# Patient Record
Sex: Female | Born: 1960 | Race: White | Hispanic: No | Marital: Married | Smoking: Never smoker
Health system: Southern US, Community
[De-identification: ages and names within clinical notes are randomized; demographics above are authoritative.]

## PROBLEM LIST (undated history)

## (undated) DIAGNOSIS — K829 Disease of gallbladder, unspecified: Secondary | ICD-10-CM

## (undated) DIAGNOSIS — M1990 Unspecified osteoarthritis, unspecified site: Secondary | ICD-10-CM

## (undated) DIAGNOSIS — B019 Varicella without complication: Secondary | ICD-10-CM

## (undated) DIAGNOSIS — E079 Disorder of thyroid, unspecified: Secondary | ICD-10-CM

## (undated) DIAGNOSIS — G4730 Sleep apnea, unspecified: Secondary | ICD-10-CM

## (undated) DIAGNOSIS — I499 Cardiac arrhythmia, unspecified: Secondary | ICD-10-CM

## (undated) DIAGNOSIS — T7840XA Allergy, unspecified, initial encounter: Secondary | ICD-10-CM

## (undated) DIAGNOSIS — I10 Essential (primary) hypertension: Secondary | ICD-10-CM

## (undated) HISTORY — PX: SHOULDER ARTHROSCOPY: SHX128

## (undated) HISTORY — PX: CARPAL TUNNEL RELEASE: SHX101

## (undated) HISTORY — PX: CHOLECYSTECTOMY: SHX55

## (undated) HISTORY — PX: ABDOMINAL HYSTERECTOMY: SHX81

## (undated) HISTORY — PX: TONSILLECTOMY: SUR1361

---

## 2018-11-05 ENCOUNTER — Other Ambulatory Visit: Payer: Self-pay | Admitting: Family Medicine

## 2018-11-05 DIAGNOSIS — Z1231 Encounter for screening mammogram for malignant neoplasm of breast: Secondary | ICD-10-CM

## 2019-04-16 ENCOUNTER — Ambulatory Visit
Admission: RE | Admit: 2019-04-16 | Discharge: 2019-04-16 | Disposition: A | Discharge status: Home / Self Care | Payer: Federal, State, Local not specified - PPO | Source: Ambulatory Visit | Attending: Family Medicine | Admitting: Family Medicine

## 2019-04-16 ENCOUNTER — Other Ambulatory Visit: Payer: Self-pay

## 2019-04-16 DIAGNOSIS — Z1231 Encounter for screening mammogram for malignant neoplasm of breast: Secondary | ICD-10-CM | POA: Insufficient documentation

## 2019-08-27 ENCOUNTER — Other Ambulatory Visit: Payer: Self-pay | Admitting: Orthopedic Surgery

## 2019-08-27 DIAGNOSIS — M1712 Unilateral primary osteoarthritis, left knee: Secondary | ICD-10-CM

## 2019-09-03 ENCOUNTER — Ambulatory Visit
Admission: RE | Admit: 2019-09-03 | Discharge: 2019-09-03 | Disposition: A | Discharge status: Home / Self Care | Payer: Federal, State, Local not specified - PPO | Source: Ambulatory Visit | Attending: Orthopedic Surgery | Admitting: Orthopedic Surgery

## 2019-09-03 ENCOUNTER — Other Ambulatory Visit: Payer: Self-pay

## 2019-09-03 DIAGNOSIS — M1712 Unilateral primary osteoarthritis, left knee: Secondary | ICD-10-CM

## 2019-11-19 ENCOUNTER — Ambulatory Visit: Payer: Federal, State, Local not specified - PPO

## 2019-11-19 ENCOUNTER — Other Ambulatory Visit: Payer: Self-pay

## 2019-11-19 DIAGNOSIS — Z20822 Contact with and (suspected) exposure to covid-19: Secondary | ICD-10-CM

## 2019-11-19 LAB — POC COVID19 BINAXNOW: SARS Coronavirus 2 Ag: NEGATIVE

## 2019-11-19 NOTE — Progress Notes (Signed)
Patient is a new hire for [employer].  Rapid Antigen test negative.

## 2019-11-20 NOTE — Progress Notes (Signed)
Please contact patient and give results. Thank you Herbert Seta

## 2020-02-19 ENCOUNTER — Other Ambulatory Visit: Payer: Self-pay | Admitting: Family Medicine

## 2020-02-19 DIAGNOSIS — Z1231 Encounter for screening mammogram for malignant neoplasm of breast: Secondary | ICD-10-CM

## 2020-04-13 ENCOUNTER — Other Ambulatory Visit: Payer: Self-pay

## 2020-04-13 ENCOUNTER — Ambulatory Visit: Payer: Federal, State, Local not specified - PPO

## 2020-04-13 NOTE — Progress Notes (Signed)
Nutrition Consult 04/13/2020  CC:  Attending for husband to learn more regarding the carbohydrate restricted diet for diabetes. (Husband is not available today and will see me next month regarding his diabetes consult).  HX:  Husband Emily Jefferson has hx of diabetes for approximately 20 years.  A1C is increasing at this time and his weight is at 233 lb and BMI at 32.25.  Both are retired and working part-time at [employer].    They have started the Keto diet to help with controlling Emily Jefferson blood glucose.  She reports his glucose is improving but his weight is staying the same.  She is gaining weight with the increased fat in the diet. She wants to decrease the amount of fat in the diet and still not change his glucose levels.    They are monitoring label sugar levels and not considering fiber and carbohydrate in the foods and dishes.  Both are getting some exercise in the evenings in their pool.  They do golf some each week.  Wife is the primarily responsible for the cooking.  They both will do grocery shopping.    Recommendations: *Use the food label and use the carbohydrate content.  Always look for fiber in each product.   Subtract fiber for net carb intake. * Increase non-starchy vegetable intake at each meal.   *Decrease the serving size of the casserole dishes that contain multiple cheeses and other fats. *Increase the frequency and duration of exercise.  Walk in the pool and plan for exercise regime on regular basis when pool has to be closed. *Start to change the fat in dishes to low fat r/t full fats.  Start with at 1 cheese  In dish * Use lean meats.  Use chicken and fish more often that the red meats. *John needs to see me next month and we will review his dietary intake and review carb counting and his nutrition/actcivity needs.  Teaching tools: *Food/Food group handout *What's on the Nutrition Facts Label *Pre-diabetes Fact Sheet (contains a number of health eating facts for  diabetes)  Plan: F/u visit with Emily Jefferson next month.

## 2020-04-13 NOTE — Progress Notes (Unsigned)
   Subjective:    Patient ID: Emily Jefferson, female    DOB: 03-11-1961, 59 y.o.   MRN: [id number]  HPI    Review of Systems     Objective:   Physical Exam        Assessment & Plan:

## 2020-04-15 ENCOUNTER — Ambulatory Visit
Admission: RE | Admit: 2020-04-15 | Discharge: 2020-04-15 | Disposition: A | Discharge status: Home / Self Care | Payer: Federal, State, Local not specified - PPO | Source: Ambulatory Visit | Attending: Family Medicine | Admitting: Family Medicine

## 2020-04-15 ENCOUNTER — Other Ambulatory Visit: Payer: Self-pay

## 2020-04-15 DIAGNOSIS — Z1231 Encounter for screening mammogram for malignant neoplasm of breast: Secondary | ICD-10-CM | POA: Insufficient documentation

## 2020-07-06 ENCOUNTER — Other Ambulatory Visit: Payer: Self-pay

## 2020-07-06 ENCOUNTER — Other Ambulatory Visit
Admission: RE | Admit: 2020-07-06 | Discharge: 2020-07-06 | Disposition: A | Discharge status: Home / Self Care | Payer: Federal, State, Local not specified - PPO | Source: Ambulatory Visit | Attending: Internal Medicine | Admitting: Internal Medicine

## 2020-07-06 DIAGNOSIS — K5730 Diverticulosis of large intestine without perforation or abscess without bleeding: Secondary | ICD-10-CM | POA: Diagnosis not present

## 2020-07-06 DIAGNOSIS — Z09 Encounter for follow-up examination after completed treatment for conditions other than malignant neoplasm: Secondary | ICD-10-CM | POA: Diagnosis not present

## 2020-07-06 DIAGNOSIS — Z79899 Other long term (current) drug therapy: Secondary | ICD-10-CM | POA: Diagnosis not present

## 2020-07-06 DIAGNOSIS — Z20822 Contact with and (suspected) exposure to covid-19: Secondary | ICD-10-CM | POA: Diagnosis not present

## 2020-07-06 DIAGNOSIS — K640 First degree hemorrhoids: Secondary | ICD-10-CM | POA: Diagnosis not present

## 2020-07-06 DIAGNOSIS — Z01812 Encounter for preprocedural laboratory examination: Secondary | ICD-10-CM | POA: Insufficient documentation

## 2020-07-06 DIAGNOSIS — Z86010 Personal history of colonic polyps: Secondary | ICD-10-CM | POA: Diagnosis not present

## 2020-07-06 DIAGNOSIS — Z79890 Hormone replacement therapy (postmenopausal): Secondary | ICD-10-CM | POA: Diagnosis not present

## 2020-07-07 ENCOUNTER — Encounter: Payer: Self-pay | Admitting: Internal Medicine

## 2020-07-07 LAB — SARS CORONAVIRUS 2 (TAT 6-24 HRS): SARS Coronavirus 2: NEGATIVE

## 2020-07-08 ENCOUNTER — Ambulatory Visit: Payer: Federal, State, Local not specified - PPO | Admitting: Anesthesiology

## 2020-07-08 ENCOUNTER — Ambulatory Visit
Admission: RE | Admit: 2020-07-08 | Discharge: 2020-07-08 | Disposition: A | Discharge status: Home / Self Care | Payer: Federal, State, Local not specified - PPO | Attending: Internal Medicine | Admitting: Internal Medicine

## 2020-07-08 ENCOUNTER — Encounter: Payer: Self-pay | Admitting: Internal Medicine

## 2020-07-08 ENCOUNTER — Encounter
Admission: RE | Disposition: A | Discharge status: Home / Self Care | Payer: Self-pay | Source: Home / Self Care | Attending: Internal Medicine

## 2020-07-08 DIAGNOSIS — K640 First degree hemorrhoids: Secondary | ICD-10-CM | POA: Insufficient documentation

## 2020-07-08 DIAGNOSIS — Z09 Encounter for follow-up examination after completed treatment for conditions other than malignant neoplasm: Secondary | ICD-10-CM | POA: Diagnosis not present

## 2020-07-08 DIAGNOSIS — K5730 Diverticulosis of large intestine without perforation or abscess without bleeding: Secondary | ICD-10-CM | POA: Insufficient documentation

## 2020-07-08 DIAGNOSIS — Z79899 Other long term (current) drug therapy: Secondary | ICD-10-CM | POA: Insufficient documentation

## 2020-07-08 DIAGNOSIS — Z79890 Hormone replacement therapy (postmenopausal): Secondary | ICD-10-CM | POA: Insufficient documentation

## 2020-07-08 DIAGNOSIS — Z20822 Contact with and (suspected) exposure to covid-19: Secondary | ICD-10-CM | POA: Insufficient documentation

## 2020-07-08 DIAGNOSIS — Z86010 Personal history of colonic polyps: Secondary | ICD-10-CM | POA: Insufficient documentation

## 2020-07-08 HISTORY — DX: Varicella without complication: B01.9

## 2020-07-08 HISTORY — DX: Essential (primary) hypertension: I10

## 2020-07-08 HISTORY — DX: Allergy, unspecified, initial encounter: T78.40XA

## 2020-07-08 HISTORY — DX: Sleep apnea, unspecified: G47.30

## 2020-07-08 HISTORY — DX: Disorder of thyroid, unspecified: E07.9

## 2020-07-08 HISTORY — DX: Unspecified osteoarthritis, unspecified site: M19.90

## 2020-07-08 HISTORY — DX: Disease of gallbladder, unspecified: K82.9

## 2020-07-08 HISTORY — PX: COLONOSCOPY WITH PROPOFOL: SHX5780

## 2020-07-08 HISTORY — DX: Cardiac arrhythmia, unspecified: I49.9

## 2020-07-08 SURGERY — COLONOSCOPY WITH PROPOFOL
Anesthesia: General

## 2020-07-08 MED ORDER — PROPOFOL 500 MG/50ML IV EMUL
INTRAVENOUS | Status: AC
  Filled 2020-07-08: qty 50

## 2020-07-08 MED ORDER — SODIUM CHLORIDE 0.9 % IV SOLN: INTRAVENOUS | Status: DC

## 2020-07-08 MED ORDER — PROPOFOL 10 MG/ML IV BOLUS
INTRAVENOUS | Status: DC | PRN
  Administered 2020-07-08: 100 mg via INTRAVENOUS

## 2020-07-08 MED ORDER — PROPOFOL 500 MG/50ML IV EMUL
INTRAVENOUS | Status: DC | PRN
  Administered 2020-07-08: 100 ug/kg/min via INTRAVENOUS

## 2020-07-08 MED ORDER — LIDOCAINE HCL (PF) 2 % IJ SOLN
INTRAMUSCULAR | Status: AC
  Filled 2020-07-08: qty 5

## 2020-07-08 NOTE — Interval H&P Note (Signed)
History and Physical Interval Note:  07/08/2020 9:22 AM  Hubbard Hartshorn  has presented today for surgery, with the diagnosis of screening.  The various methods of treatment have been discussed with the patient and family. After consideration of risks, benefits and other options for treatment, the patient has consented to  Procedure(s): COLONOSCOPY WITH PROPOFOL (N/A) as a surgical intervention.  The patient's history has been reviewed, patient examined, no change in status, stable for surgery.  I have reviewed the patient's chart and labs.  Questions were answered to the patient's satisfaction.     [place], [place]

## 2020-07-08 NOTE — Transfer of Care (Signed)
Immediate Anesthesia Transfer of Care Note  Patient: Emily Jefferson  Procedure(s) Performed: COLONOSCOPY WITH PROPOFOL (N/A )  Patient Location: Endoscopy Unit  Anesthesia Type:General  Level of Consciousness: drowsy and patient cooperative  Airway & Oxygen Therapy: Patient Spontanous Breathing and Patient connected to nasal cannula oxygen  Post-op Assessment: Report given to RN and Post -op Vital signs reviewed and stable  Post vital signs: Reviewed and stable  Last Vitals:  Vitals Value Taken Time  BP 119/69 07/08/20 0957  Temp    Pulse 64 07/08/20 0957  Resp 21 07/08/20 0957  SpO2 94 % 07/08/20 0957  Vitals shown include unvalidated device data.  Last Pain:  Vitals:   07/08/20 0909  TempSrc: Tympanic  PainSc: 0-No pain         Complications: No complications documented.

## 2020-07-08 NOTE — Anesthesia Preprocedure Evaluation (Signed)
Anesthesia Evaluation  Patient identified by MRN, date of birth, ID band Patient awake    Reviewed: Allergy & Precautions, NPO status , Patient's Chart, lab work & pertinent test results  Airway Mallampati: II   Neck ROM: Full    Dental no notable dental hx.    Pulmonary neg pulmonary ROS,    Pulmonary exam normal breath sounds clear to auscultation       Cardiovascular hypertension, Normal cardiovascular exam+ dysrhythmias  Rhythm:Regular Rate:Normal     Neuro/Psych negative neurological ROS  negative psych ROS   GI/Hepatic negative GI ROS, Neg liver ROS,   Endo/Other  negative endocrine ROS  Renal/GU negative Renal ROS  negative genitourinary   Musculoskeletal  (+) Arthritis ,   Abdominal   Peds negative pediatric ROS (+)  Hematology negative hematology ROS (+)   Anesthesia Other Findings .Marland KitchenPast Medical History: No date: Allergy No date: Arthritis No date: Chicken pox No date: Dysrhythmia No date: Gallbladder disease No date: Hypertension No date: Sleep apnea No date: Thyroid disease   Reproductive/Obstetrics negative OB ROS                             Anesthesia Physical Anesthesia Plan  ASA: II  Anesthesia Plan: General   Post-op Pain Management:    Induction: Intravenous  PONV Risk Score and Plan: 3 and Propofol infusion  Airway Management Planned: Nasal Cannula  Additional Equipment: None  Intra-op Plan:   Post-operative Plan:   Informed Consent: I have reviewed the patients History and Physical, chart, labs and discussed the procedure including the risks, benefits and alternatives for the proposed anesthesia with the patient or authorized representative who has indicated his/her understanding and acceptance.       Plan Discussed with: CRNA, Anesthesiologist and Surgeon  Anesthesia Plan Comments:         Anesthesia Quick Evaluation

## 2020-07-08 NOTE — H&P (Signed)
Outpatient short stay form Pre-procedure 07/08/2020 9:21 AM Shivank Pinedo K. Norma Fredrickson, M.D.  Primary Physician: Jerl Mina, M.D.  Reason for visit:  Personal history of adenomatous colon polyps  History of present illness:                           Patient presents for colonoscopy for a personal hx of colon polyps. The patient denies abdominal pain, abnormal weight loss or rectal bleeding.      Current Facility-Administered Medications:  .  0.9 %  sodium chloride infusion, , Intravenous, Continuous, Jaquayla Hege, Boykin Nearing, MD  Medications Prior to Admission  Medication Sig Dispense Refill Last Dose  . Azelastine HCl 137 MCG/SPRAY SOLN Place 1 spray into both nostrils. Use in each nostril as directed     . cholecalciferol (VITAMIN D3) 10 MCG (400 UNIT) TABS tablet Take 400 Units by mouth 2 (two) times daily.     Marland Kitchen glucosamine-chondroitin 500-400 MG tablet Take 1 tablet by mouth 3 (three) times daily.     Marland Kitchen levocetirizine (XYZAL) 5 MG tablet Take 5 mg by mouth every evening.   07/07/2020 at Unknown time  . levothyroxine (SYNTHROID) 137 MCG tablet Take 137 mcg by mouth daily before breakfast.   07/08/2020 at Unknown time  . losartan-hydrochlorothiazide (HYZAAR) 100-25 MG tablet Take 1 tablet by mouth daily.   07/08/2020 at Unknown time  . montelukast (SINGULAIR) 10 MG tablet Take 10 mg by mouth at bedtime.   07/07/2020 at Unknown time  . Multiple Vitamin (MULTIVITAMIN) capsule Take 1 capsule by mouth daily.        Allergies  Allergen Reactions  . Penicillins Rash     Past Medical History:  Diagnosis Date  . Allergy   . Arthritis   . Chicken pox   . Dysrhythmia   . Gallbladder disease   . Hypertension   . Sleep apnea   . Thyroid disease     Review of systems:  Otherwise negative.    Physical Exam  Gen: Alert, oriented. Appears stated age.  HEENT: [place]/AT. PERRLA. Lungs: CTA, no wheezes. CV: RR nl S1, S2. Abd: soft, benign, no masses. BS+ Ext: No edema. Pulses  2+    Planned procedures: Proceed with colonoscopy. The patient understands the nature of the planned procedure, indications, risks, alternatives and potential complications including but not limited to bleeding, infection, perforation, damage to internal organs and possible oversedation/side effects from anesthesia. The patient agrees and gives consent to proceed.  Please refer to procedure notes for findings, recommendations and patient disposition/instructions.     Mirren Gest K. Norma Fredrickson, M.D. Gastroenterology 07/08/2020  9:21 AM

## 2020-07-08 NOTE — Anesthesia Postprocedure Evaluation (Signed)
Anesthesia Post Note  Patient: Emily Jefferson  Procedure(s) Performed: COLONOSCOPY WITH PROPOFOL (N/A )  Patient location during evaluation: Endoscopy Anesthesia Type: General Level of consciousness: awake and awake and alert Pain management: pain level controlled Vital Signs Assessment: post-procedure vital signs reviewed and stable Respiratory status: spontaneous breathing and nonlabored ventilation Cardiovascular status: blood pressure returned to baseline and stable Postop Assessment: no apparent nausea or vomiting Anesthetic complications: no   No complications documented.   Last Vitals:  Vitals:   07/08/20 0957 07/08/20 1000  BP: 119/69 122/65  Pulse: 63 64  Resp: 16 (!) 21  Temp:    SpO2: 96% 94%    Last Pain:  Vitals:   07/08/20 1000  TempSrc:   PainSc: 0-No pain                 [name]

## 2020-07-08 NOTE — Op Note (Signed)
[facility] Gastroenterology Patient Name: Emily Jefferson Procedure Date: 07/08/2020 9:37 AM MRN: [id number] Account #: [id number] Date of Birth: 27-Dec-1960 Admit Type: Outpatient Age: 59 Room: [facility] ENDO ROOM 1 Gender: Female Note Status: Finalized Procedure:             Colonoscopy Indications:           Surveillance: Personal history of adenomatous polyps                         on last colonoscopy > 5 years ago Providers:             Royce Macadamia K. Norma Fredrickson MD, MD Referring MD:          Rhona Leavens. Burnett Sheng, MD (Referring MD) Medicines:             Propofol per Anesthesia Complications:         No immediate complications. Procedure:             Pre-Anesthesia Assessment:                        - The risks and benefits of the procedure and the                         sedation options and risks were discussed with the                         patient. All questions were answered and informed                         consent was obtained.                        - Patient identification and proposed procedure were                         verified prior to the procedure by the nurse. The                         procedure was verified in the procedure room.                        - ASA Grade Assessment: III - A patient with severe                         systemic disease.                        - After reviewing the risks and benefits, the patient                         was deemed in satisfactory condition to undergo the                         procedure.                        After obtaining informed consent, the colonoscope was                         passed under direct  vision. Throughout the procedure,                         the patient's blood pressure, pulse, and oxygen                         saturations were monitored continuously. The                         Colonoscope was introduced through the anus and                         advanced to the the cecum, identified  by appendiceal                         orifice and ileocecal valve. The colonoscopy was                         performed without difficulty. The patient tolerated                         the procedure well. The quality of the bowel                         preparation was good. Findings:      The perianal and digital rectal examinations were normal. Pertinent       negatives include normal sphincter tone and no palpable rectal lesions.      Many small-mouthed diverticula were found in the left colon. There was       no evidence of diverticular bleeding.      Non-bleeding internal hemorrhoids were found during retroflexion. The       hemorrhoids were Grade I (internal hemorrhoids that do not prolapse).      The exam was otherwise without abnormality. Impression:            - Mild diverticulosis in the left colon. There was no                         evidence of diverticular bleeding.                        - Non-bleeding internal hemorrhoids.                        - The examination was otherwise normal.                        - No specimens collected. Recommendation:        - Patient has a contact number available for                         emergencies. The signs and symptoms of potential                         delayed complications were discussed with the patient.                         Return to normal activities tomorrow. Written  discharge instructions were provided to the patient.                        - Resume previous diet.                        - Continue present medications.                        - Repeat colonoscopy in 10 years for screening                         purposes.                        - Return to GI office PRN.                        - The findings and recommendations were discussed with                         the patient. Procedure Code(s):     --- Professional ---                        P8099, Colorectal cancer screening;  colonoscopy on                         individual at high risk Diagnosis Code(s):     --- Professional ---                        K57.30, Diverticulosis of large intestine without                         perforation or abscess without bleeding                        K64.0, First degree hemorrhoids                        Z86.010, Personal history of colonic polyps CPT copyright 2019 American Medical Association. All rights reserved. The codes documented in this report are preliminary and upon coder review may  be revised to meet current compliance requirements. Stanton Kidney MD, MD 07/08/2020 9:56:36 AM This report has been signed electronically. Number of Addenda: 0 Note Initiated On: 07/08/2020 9:37 AM Scope Withdrawal Time: 0 hours 5 minutes 30 seconds  Total Procedure Duration: 0 hours 8 minutes 36 seconds  Estimated Blood Loss:  Estimated blood loss: none.      [facility]

## 2020-08-10 ENCOUNTER — Ambulatory Visit: Payer: Federal, State, Local not specified - PPO

## 2021-03-14 ENCOUNTER — Other Ambulatory Visit: Payer: Self-pay | Admitting: Family Medicine

## 2021-03-14 DIAGNOSIS — Z1231 Encounter for screening mammogram for malignant neoplasm of breast: Secondary | ICD-10-CM

## 2021-04-19 ENCOUNTER — Other Ambulatory Visit: Payer: Self-pay

## 2021-04-19 ENCOUNTER — Ambulatory Visit
Admission: RE | Admit: 2021-04-19 | Discharge: 2021-04-19 | Disposition: A | Discharge status: Home / Self Care | Payer: Federal, State, Local not specified - PPO | Source: Ambulatory Visit | Attending: Family Medicine | Admitting: Family Medicine

## 2021-04-19 DIAGNOSIS — Z1231 Encounter for screening mammogram for malignant neoplasm of breast: Secondary | ICD-10-CM | POA: Diagnosis not present

## 2021-06-10 ENCOUNTER — Ambulatory Visit: Payer: Federal, State, Local not specified - PPO | Attending: Internal Medicine

## 2021-06-10 ENCOUNTER — Ambulatory Visit: Payer: Federal, State, Local not specified - PPO

## 2021-06-10 ENCOUNTER — Other Ambulatory Visit: Payer: Self-pay

## 2021-06-10 DIAGNOSIS — Z23 Encounter for immunization: Secondary | ICD-10-CM

## 2021-06-10 MED ORDER — PFIZER COVID-19 VAC BIVALENT 30 MCG/0.3ML IM SUSP
INTRAMUSCULAR | 0 refills | Status: DC
  Filled 2021-06-10: qty 0.3, 1d supply, fill #0

## 2021-06-10 NOTE — Progress Notes (Signed)
   Covid-19 Vaccination Clinic  Name:  Emily Jefferson    MRN: [id number] DOB: Nov 30, 1960  06/10/2021  Emily Jefferson was observed post Covid-19 immunization for 15 minutes without incident. She was provided with Vaccine Information Sheet and instruction to access the V-Safe system.   Emily Jefferson was instructed to call 911 with any severe reactions post vaccine: Difficulty breathing  Swelling of face and throat  A fast heartbeat  A bad rash all over body  Dizziness and weakness   Drusilla Kanner, PharmD, MBA Clinical Acute Care Pharmacist

## 2022-01-03 ENCOUNTER — Encounter: Payer: Federal, State, Local not specified - PPO | Admitting: Obstetrics and Gynecology

## 2022-03-10 ENCOUNTER — Other Ambulatory Visit: Payer: Self-pay | Admitting: Family Medicine

## 2022-03-10 DIAGNOSIS — Z1231 Encounter for screening mammogram for malignant neoplasm of breast: Secondary | ICD-10-CM

## 2022-04-20 ENCOUNTER — Ambulatory Visit
Admission: RE | Admit: 2022-04-20 | Discharge: 2022-04-20 | Disposition: A | Discharge status: Home / Self Care | Payer: Federal, State, Local not specified - PPO | Source: Ambulatory Visit | Attending: Family Medicine | Admitting: Family Medicine

## 2022-04-20 DIAGNOSIS — Z1231 Encounter for screening mammogram for malignant neoplasm of breast: Secondary | ICD-10-CM | POA: Diagnosis present

## 2022-05-03 DIAGNOSIS — G4733 Obstructive sleep apnea (adult) (pediatric): Secondary | ICD-10-CM | POA: Diagnosis not present

## 2022-06-27 DIAGNOSIS — E063 Autoimmune thyroiditis: Secondary | ICD-10-CM | POA: Diagnosis not present

## 2022-06-27 DIAGNOSIS — E039 Hypothyroidism, unspecified: Secondary | ICD-10-CM | POA: Diagnosis not present

## 2022-07-04 DIAGNOSIS — Z6841 Body Mass Index (BMI) 40.0 and over, adult: Secondary | ICD-10-CM | POA: Diagnosis not present

## 2022-07-04 DIAGNOSIS — E669 Obesity, unspecified: Secondary | ICD-10-CM | POA: Diagnosis not present

## 2022-07-04 DIAGNOSIS — I10 Essential (primary) hypertension: Secondary | ICD-10-CM | POA: Diagnosis not present

## 2022-07-04 DIAGNOSIS — E039 Hypothyroidism, unspecified: Secondary | ICD-10-CM | POA: Diagnosis not present

## 2022-07-04 DIAGNOSIS — E063 Autoimmune thyroiditis: Secondary | ICD-10-CM | POA: Diagnosis not present

## 2022-07-05 DIAGNOSIS — I2089 Other forms of angina pectoris: Secondary | ICD-10-CM | POA: Diagnosis not present

## 2022-07-05 DIAGNOSIS — I10 Essential (primary) hypertension: Secondary | ICD-10-CM | POA: Diagnosis not present

## 2022-07-05 DIAGNOSIS — G4733 Obstructive sleep apnea (adult) (pediatric): Secondary | ICD-10-CM | POA: Diagnosis not present

## 2022-07-24 DIAGNOSIS — I10 Essential (primary) hypertension: Secondary | ICD-10-CM | POA: Diagnosis not present

## 2022-07-24 DIAGNOSIS — E039 Hypothyroidism, unspecified: Secondary | ICD-10-CM | POA: Diagnosis not present

## 2022-07-24 DIAGNOSIS — Z23 Encounter for immunization: Secondary | ICD-10-CM | POA: Diagnosis not present

## 2022-08-15 DIAGNOSIS — G4733 Obstructive sleep apnea (adult) (pediatric): Secondary | ICD-10-CM | POA: Diagnosis not present

## 2022-09-04 ENCOUNTER — Encounter: Payer: Self-pay | Admitting: Physician Assistant

## 2022-09-04 ENCOUNTER — Ambulatory Visit (INDEPENDENT_AMBULATORY_CARE_PROVIDER_SITE_OTHER): Payer: Self-pay | Admitting: Physician Assistant

## 2022-09-04 VITALS — BP 132/82 | HR 78 | Temp 98.0°F | Wt 315.6 lb

## 2022-09-04 DIAGNOSIS — R058 Other specified cough: Secondary | ICD-10-CM

## 2022-09-04 DIAGNOSIS — U071 COVID-19: Secondary | ICD-10-CM

## 2022-09-04 LAB — POC SOFIA 2 FLU + SARS ANTIGEN FIA
Influenza A, POC: NEGATIVE
Influenza B, POC: NEGATIVE
SARS Coronavirus 2 Ag: POSITIVE — AB

## 2022-09-04 NOTE — Progress Notes (Signed)
[facility] [address] [place], [place] [address]   Office Visit Note  Patient Name: Emily Jefferson Date of Birth [id number]  Medical Record number [id number]  Date of Service: 09/04/2022  Chief Complaint  Patient presents with   Cough    Sat had 101 fever. Mucus is clear. Has been taking OTC meds. Headache, St that is gone. No BA or chills      62 y/o F presents to the clinic for c/o sore throat and fever x 4 days, but resolved now. She then started with cough and has continued. No known exposure to flu or covid. Has been taking otc Tylenol for her symptoms and helping. She denies body aches, chills, CP, SOB, wheezing.   Cough Pertinent negatives include no shortness of breath or wheezing.      Current Medication:  Outpatient Encounter Medications as of 09/04/2022  Medication Sig   ARMOUR THYROID 180 MG tablet Take 180 mg by mouth daily.   cholecalciferol (VITAMIN D3) 10 MCG (400 UNIT) TABS tablet Take 400 Units by mouth 2 (two) times daily.   levocetirizine (XYZAL) 5 MG tablet Take 5 mg by mouth every evening.   liothyronine (CYTOMEL) 5 MCG tablet Take by mouth.   losartan-hydrochlorothiazide (HYZAAR) 100-25 MG tablet Take 1 tablet by mouth daily.   meloxicam (MOBIC) 7.5 MG tablet Take 1 tablet by mouth 2 (two) times daily.   montelukast (SINGULAIR) 10 MG tablet Take 10 mg by mouth at bedtime.   Multiple Vitamin (MULTIVITAMIN) capsule Take 1 capsule by mouth daily.   Azelastine HCl 137 MCG/SPRAY SOLN Place 1 spray into both nostrils. Use in each nostril as directed (Patient not taking: Reported on 09/04/2022)   COVID-19 mRNA bivalent vaccine, Pfizer, (PFIZER COVID-19 VAC BIVALENT) injection Inject into the muscle. (Patient not taking: Reported on 09/04/2022)   glucosamine-chondroitin 500-400 MG tablet Take 1 tablet by mouth 3 (three) times daily. (Patient not taking: Reported on 09/04/2022)   levothyroxine (SYNTHROID) 137 MCG tablet Take 137 mcg by mouth daily before  breakfast. (Patient not taking: Reported on 09/04/2022)   No facility-administered encounter medications on file as of 09/04/2022.      Medical History: Past Medical History:  Diagnosis Date   Allergy    Arthritis    Chicken pox    Dysrhythmia    Gallbladder disease    Hypertension    Sleep apnea    Thyroid disease      Vital Signs: BP 132/82 (BP Location: Left Arm, Patient Position: Standing, Cuff Size: Normal)   Pulse 78   Temp 98 F (36.7 C) (Tympanic)   Wt (!) 315 lb 9.6 oz (143.2 kg)   SpO2 97%   BMI 52.52 kg/m    Review of Systems  Constitutional: Negative.   HENT: Negative.    Respiratory:  Positive for cough. Negative for chest tightness, shortness of breath and wheezing.     Physical Exam Constitutional:      Appearance: Normal appearance.  HENT:     Head: Atraumatic.     Right Ear: Tympanic membrane, ear canal and external ear normal.     Left Ear: Tympanic membrane, ear canal and external ear normal.     Nose: Nose normal.     Mouth/Throat:     Mouth: Mucous membranes are moist.     Pharynx: Oropharynx is clear.  Eyes:     Extraocular Movements: Extraocular movements intact.  Cardiovascular:     Rate and Rhythm: Normal rate and regular  rhythm.  Pulmonary:     Effort: Pulmonary effort is normal.     Breath sounds: Normal breath sounds.     Comments: No cough throughout the examination Musculoskeletal:     Cervical back: Neck supple.  Skin:    General: Skin is warm.  Neurological:     Mental Status: She is alert.  Psychiatric:        Mood and Affect: Mood normal.        Behavior: Behavior normal.        Thought Content: Thought content normal.        Judgment: Judgment normal.       Assessment/Plan:  1. COVID-19 virus infection  2. Cough productive of clear sputum - POC SOFIA 2 FLU + SARS ANTIGEN FIA  Reviewed positive covid test result.  Increase fluids Take otc cough expectorant and suppressant ie Mucinex DM as directed on the  box.  Reviewed CDC guidelines with patient. Pt will be isolated for 5 days from the start of symptoms and use a mask for an additional 5 days. Must be fever free for 24 hours prior to returning to campus.  General Counseling: diksha tagliaferro understanding of the findings of todays visit and agrees with plan of treatment. I have discussed any further diagnostic evaluation that may be needed or ordered today. We also reviewed her medications today. she has been encouraged to call the office with any questions or concerns that should arise related to todays visit.    Time spent:30 Wise, [place] Physician Assistant

## 2022-10-03 DIAGNOSIS — I10 Essential (primary) hypertension: Secondary | ICD-10-CM | POA: Diagnosis not present

## 2022-10-03 DIAGNOSIS — E063 Autoimmune thyroiditis: Secondary | ICD-10-CM | POA: Diagnosis not present

## 2022-10-03 DIAGNOSIS — E039 Hypothyroidism, unspecified: Secondary | ICD-10-CM | POA: Diagnosis not present

## 2022-10-10 DIAGNOSIS — Z6841 Body Mass Index (BMI) 40.0 and over, adult: Secondary | ICD-10-CM | POA: Diagnosis not present

## 2022-10-10 DIAGNOSIS — E039 Hypothyroidism, unspecified: Secondary | ICD-10-CM | POA: Diagnosis not present

## 2022-10-10 DIAGNOSIS — E063 Autoimmune thyroiditis: Secondary | ICD-10-CM | POA: Diagnosis not present

## 2022-10-10 DIAGNOSIS — I10 Essential (primary) hypertension: Secondary | ICD-10-CM | POA: Diagnosis not present

## 2022-10-10 DIAGNOSIS — E669 Obesity, unspecified: Secondary | ICD-10-CM | POA: Diagnosis not present

## 2022-11-18 ENCOUNTER — Other Ambulatory Visit: Payer: Self-pay

## 2022-11-18 ENCOUNTER — Inpatient Hospital Stay
Admission: EM | Admit: 2022-11-18 | Discharge: 2022-11-20 | DRG: 194 | Disposition: A | Discharge status: Home / Self Care | Payer: Federal, State, Local not specified - PPO | Attending: Internal Medicine | Admitting: Internal Medicine

## 2022-11-18 ENCOUNTER — Ambulatory Visit
Admission: RE | Admit: 2022-11-18 | Discharge: 2022-11-18 | Disposition: A | Discharge status: Home / Self Care | Payer: Federal, State, Local not specified - PPO | Source: Ambulatory Visit | Attending: Emergency Medicine | Admitting: Emergency Medicine

## 2022-11-18 ENCOUNTER — Emergency Department: Payer: Federal, State, Local not specified - PPO

## 2022-11-18 VITALS — BP 112/71 | HR 95 | Temp 98.3°F | Resp 22

## 2022-11-18 DIAGNOSIS — Z79890 Hormone replacement therapy (postmenopausal): Secondary | ICD-10-CM | POA: Diagnosis not present

## 2022-11-18 DIAGNOSIS — Z23 Encounter for immunization: Secondary | ICD-10-CM | POA: Diagnosis not present

## 2022-11-18 DIAGNOSIS — E039 Hypothyroidism, unspecified: Secondary | ICD-10-CM

## 2022-11-18 DIAGNOSIS — Z8249 Family history of ischemic heart disease and other diseases of the circulatory system: Secondary | ICD-10-CM

## 2022-11-18 DIAGNOSIS — R0602 Shortness of breath: Secondary | ICD-10-CM

## 2022-11-18 DIAGNOSIS — G4730 Sleep apnea, unspecified: Secondary | ICD-10-CM | POA: Diagnosis not present

## 2022-11-18 DIAGNOSIS — J209 Acute bronchitis, unspecified: Secondary | ICD-10-CM | POA: Diagnosis present

## 2022-11-18 DIAGNOSIS — J189 Pneumonia, unspecified organism: Secondary | ICD-10-CM | POA: Diagnosis not present

## 2022-11-18 DIAGNOSIS — Z833 Family history of diabetes mellitus: Secondary | ICD-10-CM

## 2022-11-18 DIAGNOSIS — Z79899 Other long term (current) drug therapy: Secondary | ICD-10-CM

## 2022-11-18 DIAGNOSIS — E876 Hypokalemia: Secondary | ICD-10-CM | POA: Diagnosis not present

## 2022-11-18 DIAGNOSIS — Z66 Do not resuscitate: Secondary | ICD-10-CM | POA: Diagnosis present

## 2022-11-18 DIAGNOSIS — Z1152 Encounter for screening for COVID-19: Secondary | ICD-10-CM | POA: Diagnosis not present

## 2022-11-18 DIAGNOSIS — R590 Localized enlarged lymph nodes: Secondary | ICD-10-CM | POA: Diagnosis present

## 2022-11-18 DIAGNOSIS — R0902 Hypoxemia: Secondary | ICD-10-CM | POA: Diagnosis not present

## 2022-11-18 DIAGNOSIS — J168 Pneumonia due to other specified infectious organisms: Secondary | ICD-10-CM | POA: Diagnosis not present

## 2022-11-18 DIAGNOSIS — R051 Acute cough: Secondary | ICD-10-CM

## 2022-11-18 DIAGNOSIS — Z880 Allergy status to penicillin: Secondary | ICD-10-CM

## 2022-11-18 DIAGNOSIS — J9811 Atelectasis: Secondary | ICD-10-CM | POA: Diagnosis not present

## 2022-11-18 DIAGNOSIS — I5A Non-ischemic myocardial injury (non-traumatic): Secondary | ICD-10-CM

## 2022-11-18 DIAGNOSIS — Z6841 Body Mass Index (BMI) 40.0 and over, adult: Secondary | ICD-10-CM

## 2022-11-18 DIAGNOSIS — I700 Atherosclerosis of aorta: Secondary | ICD-10-CM | POA: Diagnosis not present

## 2022-11-18 DIAGNOSIS — J123 Human metapneumovirus pneumonia: Principal | ICD-10-CM | POA: Diagnosis present

## 2022-11-18 DIAGNOSIS — G4733 Obstructive sleep apnea (adult) (pediatric): Secondary | ICD-10-CM | POA: Diagnosis present

## 2022-11-18 DIAGNOSIS — R059 Cough, unspecified: Secondary | ICD-10-CM | POA: Diagnosis not present

## 2022-11-18 DIAGNOSIS — B348 Other viral infections of unspecified site: Secondary | ICD-10-CM | POA: Diagnosis not present

## 2022-11-18 DIAGNOSIS — I10 Essential (primary) hypertension: Secondary | ICD-10-CM | POA: Diagnosis not present

## 2022-11-18 DIAGNOSIS — R9431 Abnormal electrocardiogram [ECG] [EKG]: Secondary | ICD-10-CM | POA: Diagnosis not present

## 2022-11-18 LAB — CBC WITH DIFFERENTIAL/PLATELET
Abs Immature Granulocytes: 0.01 10*3/uL (ref 0.00–0.07)
Basophils Absolute: 0 10*3/uL (ref 0.0–0.1)
Basophils Relative: 1 %
Eosinophils Absolute: 0.2 10*3/uL (ref 0.0–0.5)
Eosinophils Relative: 2 %
HCT: 46.9 % — ABNORMAL HIGH (ref 36.0–46.0)
Hemoglobin: 15.9 g/dL — ABNORMAL HIGH (ref 12.0–15.0)
Immature Granulocytes: 0 %
Lymphocytes Relative: 28 %
Lymphs Abs: 1.8 10*3/uL (ref 0.7–4.0)
MCH: 30.3 pg (ref 26.0–34.0)
MCHC: 33.9 g/dL (ref 30.0–36.0)
MCV: 89.3 fL (ref 80.0–100.0)
Monocytes Absolute: 0.8 10*3/uL (ref 0.1–1.0)
Monocytes Relative: 12 %
Neutro Abs: 3.6 10*3/uL (ref 1.7–7.7)
Neutrophils Relative %: 57 %
Platelets: 198 10*3/uL (ref 150–400)
RBC: 5.25 MIL/uL — ABNORMAL HIGH (ref 3.87–5.11)
RDW: 13.2 % (ref 11.5–15.5)
WBC: 6.4 10*3/uL (ref 4.0–10.5)
nRBC: 0 % (ref 0.0–0.2)

## 2022-11-18 LAB — RESP PANEL BY RT-PCR (RSV, FLU A&B, COVID)  RVPGX2
Influenza A by PCR: NEGATIVE
Influenza B by PCR: NEGATIVE
Resp Syncytial Virus by PCR: NEGATIVE
SARS Coronavirus 2 by RT PCR: NEGATIVE

## 2022-11-18 LAB — COMPREHENSIVE METABOLIC PANEL
ALT: 28 U/L (ref 0–44)
AST: 49 U/L — ABNORMAL HIGH (ref 15–41)
Albumin: 4.2 g/dL (ref 3.5–5.0)
Alkaline Phosphatase: 83 U/L (ref 38–126)
Anion gap: 11 (ref 5–15)
BUN: 16 mg/dL (ref 6–20)
CO2: 22 mmol/L (ref 22–32)
Calcium: 8.4 mg/dL — ABNORMAL LOW (ref 8.9–10.3)
Chloride: 105 mmol/L (ref 98–111)
Creatinine, Ser: 0.75 mg/dL (ref 0.44–1.00)
GFR, Estimated: >60 mL/min (ref >60)
Glucose, Bld: 121 mg/dL — ABNORMAL HIGH (ref 70–99)
Potassium: 3.3 mmol/L — ABNORMAL LOW (ref 3.5–5.1)
Sodium: 138 mmol/L (ref 135–145)
Total Bilirubin: 0.4 mg/dL (ref 0.3–1.2)
Total Protein: 8.4 g/dL — ABNORMAL HIGH (ref 6.5–8.1)

## 2022-11-18 LAB — TROPONIN I (HIGH SENSITIVITY)
Troponin I (High Sensitivity): 26 ng/L — ABNORMAL HIGH (ref <18)
Troponin I (High Sensitivity): 25 ng/L — ABNORMAL HIGH (ref <18)

## 2022-11-18 LAB — BRAIN NATRIURETIC PEPTIDE: B Natriuretic Peptide: 63.2 pg/mL (ref 0.0–100.0)

## 2022-11-18 LAB — MAGNESIUM: Magnesium: 2.1 mg/dL (ref 1.7–2.4)

## 2022-11-18 MED ORDER — IPRATROPIUM-ALBUTEROL 0.5-2.5 (3) MG/3ML IN SOLN
3.0000 mL | Freq: Once | RESPIRATORY_TRACT | Status: AC
  Administered 2022-11-18: 3 mL via RESPIRATORY_TRACT
  Filled 2022-11-18: qty 3

## 2022-11-18 MED ORDER — ASPIRIN 81 MG PO TBEC
81.0000 mg | DELAYED_RELEASE_TABLET | Freq: Every day | ORAL | Status: DC
  Administered 2022-11-18: 81 mg via ORAL
  Filled 2022-11-18: qty 1

## 2022-11-18 MED ORDER — POTASSIUM CHLORIDE CRYS ER 20 MEQ PO TBCR
40.0000 meq | EXTENDED_RELEASE_TABLET | Freq: Once | ORAL | Status: AC
  Administered 2022-11-18: 40 meq via ORAL
  Filled 2022-11-18: qty 2

## 2022-11-18 MED ORDER — ENOXAPARIN SODIUM 80 MG/0.8ML IJ SOSY
0.5000 mg/kg | PREFILLED_SYRINGE | INTRAMUSCULAR | Status: DC
  Administered 2022-11-18 – 2022-11-19 (×2): 70 mg via SUBCUTANEOUS
  Filled 2022-11-18 (×2): qty 0.72

## 2022-11-18 MED ORDER — SODIUM CHLORIDE 0.9 % IV SOLN
2.0000 g | Freq: Once | INTRAVENOUS | Status: AC
  Administered 2022-11-18: 2 g via INTRAVENOUS
  Filled 2022-11-18: qty 20

## 2022-11-18 MED ORDER — SODIUM CHLORIDE 0.9 % IV SOLN
2.0000 g | INTRAVENOUS | Status: DC
  Filled 2022-11-18: qty 20

## 2022-11-18 MED ORDER — METHYLPREDNISOLONE SODIUM SUCC 125 MG IJ SOLR
80.0000 mg | Freq: Two times a day (BID) | INTRAMUSCULAR | Status: DC
  Administered 2022-11-18 – 2022-11-19 (×2): 80 mg via INTRAVENOUS
  Filled 2022-11-18 (×3): qty 2

## 2022-11-18 MED ORDER — PNEUMOCOCCAL 20-VAL CONJ VACC 0.5 ML IM SUSY
0.5000 mL | PREFILLED_SYRINGE | INTRAMUSCULAR | Status: AC
  Administered 2022-11-19: 0.5 mL via INTRAMUSCULAR
  Filled 2022-11-18: qty 0.5

## 2022-11-18 MED ORDER — IPRATROPIUM-ALBUTEROL 0.5-2.5 (3) MG/3ML IN SOLN
3.0000 mL | Freq: Four times a day (QID) | RESPIRATORY_TRACT | Status: DC
  Administered 2022-11-19 (×3): 3 mL via RESPIRATORY_TRACT
  Filled 2022-11-18 (×3): qty 3

## 2022-11-18 MED ORDER — SODIUM CHLORIDE 0.9 % IV BOLUS
1000.0000 mL | Freq: Once | INTRAVENOUS | Status: AC
  Administered 2022-11-18: 1000 mL via INTRAVENOUS

## 2022-11-18 MED ORDER — MONTELUKAST SODIUM 10 MG PO TABS
10.0000 mg | ORAL_TABLET | Freq: Every day | ORAL | Status: DC
  Administered 2022-11-18 – 2022-11-19 (×2): 10 mg via ORAL
  Filled 2022-11-18 (×2): qty 1

## 2022-11-18 MED ORDER — ACETAMINOPHEN 325 MG PO TABS: 650.0000 mg | ORAL_TABLET | Freq: Four times a day (QID) | ORAL | Status: DC | PRN

## 2022-11-18 MED ORDER — ADULT MULTIVITAMIN W/MINERALS CH
1.0000 | ORAL_TABLET | Freq: Every day | ORAL | Status: DC
  Administered 2022-11-19 – 2022-11-20 (×2): 1 via ORAL
  Filled 2022-11-18 (×3): qty 1

## 2022-11-18 MED ORDER — IOHEXOL 350 MG/ML SOLN
75.0000 mL | Freq: Once | INTRAVENOUS | Status: AC | PRN
  Administered 2022-11-18: 75 mL via INTRAVENOUS

## 2022-11-18 MED ORDER — ALBUTEROL SULFATE (2.5 MG/3ML) 0.083% IN NEBU: 2.5000 mg | INHALATION_SOLUTION | RESPIRATORY_TRACT | Status: DC | PRN

## 2022-11-18 MED ORDER — HYDROCHLOROTHIAZIDE 25 MG PO TABS
25.0000 mg | ORAL_TABLET | Freq: Every day | ORAL | Status: DC
  Administered 2022-11-19 – 2022-11-20 (×2): 25 mg via ORAL
  Filled 2022-11-18 (×2): qty 1

## 2022-11-18 MED ORDER — METHYLPREDNISOLONE SODIUM SUCC 125 MG IJ SOLR
125.0000 mg | Freq: Once | INTRAMUSCULAR | Status: AC
  Administered 2022-11-18: 125 mg via INTRAVENOUS
  Filled 2022-11-18: qty 2

## 2022-11-18 MED ORDER — ALBUTEROL SULFATE (2.5 MG/3ML) 0.083% IN NEBU
2.5000 mg | INHALATION_SOLUTION | RESPIRATORY_TRACT | Status: AC
  Administered 2022-11-18: 2.5 mg via RESPIRATORY_TRACT

## 2022-11-18 MED ORDER — DM-GUAIFENESIN ER 30-600 MG PO TB12
1.0000 | ORAL_TABLET | Freq: Two times a day (BID) | ORAL | Status: DC | PRN
  Administered 2022-11-18 – 2022-11-19 (×2): 1 via ORAL
  Filled 2022-11-18 (×3): qty 1

## 2022-11-18 MED ORDER — AZITHROMYCIN 500 MG PO TABS
500.0000 mg | ORAL_TABLET | Freq: Once | ORAL | Status: AC
  Administered 2022-11-18: 500 mg via ORAL
  Filled 2022-11-18: qty 1

## 2022-11-18 MED ORDER — HYDRALAZINE HCL 20 MG/ML IJ SOLN: 5.0000 mg | INTRAMUSCULAR | Status: DC | PRN

## 2022-11-18 MED ORDER — LIOTHYRONINE SODIUM 5 MCG PO TABS
10.0000 ug | ORAL_TABLET | Freq: Every day | ORAL | Status: DC
  Administered 2022-11-19 – 2022-11-20 (×2): 10 ug via ORAL
  Filled 2022-11-18 (×2): qty 2

## 2022-11-18 MED ORDER — THYROID 30 MG PO TABS
180.0000 mg | ORAL_TABLET | ORAL | Status: DC
  Administered 2022-11-18: 180 mg via ORAL
  Filled 2022-11-18 (×2): qty 6

## 2022-11-18 MED ORDER — LOSARTAN POTASSIUM 50 MG PO TABS
100.0000 mg | ORAL_TABLET | Freq: Every day | ORAL | Status: DC
  Administered 2022-11-19 – 2022-11-20 (×2): 100 mg via ORAL
  Filled 2022-11-18 (×2): qty 2

## 2022-11-18 MED ORDER — AZITHROMYCIN 500 MG PO TABS
500.0000 mg | ORAL_TABLET | Freq: Every day | ORAL | Status: DC
  Administered 2022-11-19 – 2022-11-20 (×2): 500 mg via ORAL
  Filled 2022-11-18 (×2): qty 1

## 2022-11-18 MED ORDER — ONDANSETRON HCL 4 MG/2ML IJ SOLN: 4.0000 mg | Freq: Three times a day (TID) | INTRAMUSCULAR | Status: DC | PRN

## 2022-11-18 MED ORDER — IPRATROPIUM-ALBUTEROL 0.5-2.5 (3) MG/3ML IN SOLN
3.0000 mL | RESPIRATORY_TRACT | Status: DC
  Administered 2022-11-18 (×2): 3 mL via RESPIRATORY_TRACT
  Filled 2022-11-18 (×2): qty 3

## 2022-11-18 MED ORDER — LORATADINE 10 MG PO TABS
10.0000 mg | ORAL_TABLET | Freq: Every evening | ORAL | Status: DC
  Administered 2022-11-18 – 2022-11-19 (×2): 10 mg via ORAL
  Filled 2022-11-18 (×2): qty 1

## 2022-11-18 MED ORDER — THYROID 60 MG PO TABS
90.0000 mg | ORAL_TABLET | ORAL | Status: DC
  Administered 2022-11-19: 90 mg via ORAL
  Filled 2022-11-18: qty 1

## 2022-11-18 MED ORDER — LOSARTAN POTASSIUM-HCTZ 100-25 MG PO TABS: 1.0000 | ORAL_TABLET | Freq: Every day | ORAL | Status: DC

## 2022-11-18 NOTE — ED Provider Notes (Signed)
[facility] Provider Note    Event Date/Time   First MD Initiated Contact with Patient 11/18/22 1307     (approximate)   History   Cough   HPI  Emily Jefferson is a 62 y.o. female who comes in with cough, congestion.  Patient reports pink-tinged sputum.  Patient noted to have some wheezing and oxygen level 86-90% triage sent here for further evaluation.  Patient denies any history of lung problems in the past denies any smoking history.  She states that she has been sick for about a week but then the shortness of breath has seemingly been getting worse.  Denies any falls hitting her head or severe headaches.  Denies any abdominal pain.   Physical Exam   Triage Vital Signs: ED Triage Vitals  Enc Vitals Group     BP 11/18/22 1244 121/80     Pulse Rate 11/18/22 1244 88     Resp 11/18/22 1244 (!) 23     Temp 11/18/22 1244 98.4 F (36.9 C)     Temp Source 11/18/22 1244 Oral     SpO2 11/18/22 1243 90 %     Weight --      Height --      Head Circumference --      Peak Flow --      Pain Score 11/18/22 1245 0     Pain Loc --      Pain Edu? --      Excl. in [place]? --     Most recent vital signs: Vitals:   11/18/22 1244 11/18/22 1245  BP: 121/80   Pulse: 88   Resp: (!) 23 (!) 22  Temp: 98.4 F (36.9 C)   SpO2: 93% 95%     General: Awake, no distress.  CV:  Good peripheral perfusion.  Resp:  Normal effort.  Wheezing noted bilaterally Abd:  No distention.  Other:  No swelling in legs.  No calf tenderness   ED Results / Procedures / Treatments   Labs (all labs ordered are listed, but only abnormal results are displayed) Labs Reviewed  CBC WITH DIFFERENTIAL/PLATELET - Abnormal; Notable for the following components:      Result Value   RBC 5.25 (*)    Hemoglobin 15.9 (*)    HCT 46.9 (*)    All other components within normal limits  RESP PANEL BY RT-PCR (RSV, FLU A&B, COVID)  [id number]  COMPREHENSIVE METABOLIC PANEL  TROPONIN I (HIGH  SENSITIVITY)     EKG  My interpretation of EKG:  Normal sinus rhythm 77 without any ST elevation, T wave versions in inferior lateral leads, normal intervals  RADIOLOGY I have reviewed the xray personally and interpreted and no pneumonia   PROCEDURES:  Critical Care performed: Yes, see critical care procedure note(s)  .Critical Care  Performed by: Vanessa [place], MD Authorized by: Vanessa Sweetwater, MD   Critical care provider statement:    Critical care time (minutes):  30   Critical care was necessary to treat or prevent imminent or life-threatening deterioration of the following conditions:  Respiratory failure   Critical care was time spent personally by me on the following activities:  Development of treatment plan with patient or surrogate, discussions with consultants, evaluation of patient's response to treatment, examination of patient, ordering and review of laboratory studies, ordering and review of radiographic studies, ordering and performing treatments and interventions, pulse oximetry, re-evaluation of patient's condition and review of old charts .1-3 Lead EKG Interpretation  Performed by: Vanessa [place], MD Authorized by: Vanessa [place], MD     Interpretation: normal     ECG rate:  88   ECG rate assessment: normal     Rhythm: sinus rhythm     Ectopy: none     Conduction: normal      MEDICATIONS ORDERED IN ED: Medications  iohexol (OMNIPAQUE) 350 MG/ML injection 75 mL (has no administration in time range)  methylPREDNISolone sodium succinate (SOLU-MEDROL) 125 mg/2 mL injection 125 mg (125 mg Intravenous Given 11/18/22 1357)  ipratropium-albuterol (DUONEB) 0.5-2.5 (3) MG/3ML nebulizer solution 3 mL (3 mLs Nebulization Given 11/18/22 1357)  ipratropium-albuterol (DUONEB) 0.5-2.5 (3) MG/3ML nebulizer solution 3 mL (3 mLs Nebulization Given 11/18/22 1358)  ipratropium-albuterol (DUONEB) 0.5-2.5 (3) MG/3ML nebulizer solution 3 mL (3 mLs Nebulization Given 11/18/22 1359)      IMPRESSION / MDM / ASSESSMENT AND PLAN / ED COURSE  I reviewed the triage vital signs and the nursing notes.   Patient's presentation is most consistent with acute presentation with potential threat to life or bodily function.   Patient comes in with some low-grade oxygen levels 86 to 90% with wheezing on examination but no history of asthma, COPD.  Will test for COVID, flu.  She is T wave inversions so we will get a CT PE to evaluate for pulmonary embolism.  BNP is normal CBC reassuring potassium slightly low.  Troponin slightly elevated.  COVID, flu negative  Troponin is downtrending suspect demand  Patient was attempted be taken off oxygen levels went down to 88%.  CT imaging consistent with pneumonia no PE.  Will discuss with hospital team for admission  The patient is on the cardiac monitor to evaluate for evidence of arrhythmia and/or significant heart rate changes.      FINAL CLINICAL IMPRESSION(S) / ED DIAGNOSES   Final diagnoses:  Acute bronchitis, unspecified organism  Pneumonia due to infectious organism, unspecified laterality, unspecified part of lung     Rx / DC Orders   ED Discharge Orders     None        Note:  This document was prepared using Dragon voice recognition software and may include unintentional dictation errors.   Vanessa Schleicher, MD 11/18/22 [phone]

## 2022-11-18 NOTE — ED Triage Notes (Signed)
Pt sent from urgent care for oxygen of 86-90% post nebulizer. Pt c/o productive cough x5 days, denies fever. Pt reports DOE, mild lightheadedness, and nasal congestion. Pt AOX4, NAD noted. Respirations even, labored with exertion.

## 2022-11-18 NOTE — Progress Notes (Signed)
PHARMACIST - PHYSICIAN COMMUNICATION  CONCERNING:  Enoxaparin (Lovenox) for DVT Prophylaxis    RECOMMENDATION: Patient was prescribed enoxaprin 40mg  q24 hours for VTE prophylaxis.   Filed Weights   11/18/22 1637  Weight: (!) 142.6 kg (314 lb 6 oz)    Body mass index is 52.31 kg/m.  Estimated Creatinine Clearance: 107.7 mL/min (by C-G formula based on SCr of 0.75 mg/dL).   Based on [place] patient is candidate for enoxaparin 0.5mg /kg TBW SQ every 24 hours based on BMI being >30.   DESCRIPTION: Pharmacy has adjusted enoxaparin dose per [facility] policy.  Patient is now receiving enoxaparin 0.5 mg/kg every 24 hours    Glean Salvo, PharmD, BCPS Clinical Pharmacist  11/18/2022 5:57 PM

## 2022-11-18 NOTE — ED Notes (Signed)
Patient is being discharged from the Urgent Care and sent to the Emergency Department via POV w/ husband . Per Barkley Boards NP, patient is in need of higher level of care due to hypoxia. Patient is aware and verbalizes understanding of plan of care.  Vitals:   11/18/22 1218 11/18/22 1220  BP:  112/71  Pulse:    Resp:    Temp:    SpO2: (!) 86% 97%

## 2022-11-18 NOTE — ED Notes (Signed)
Pt placed on 2 liters [place] at this time.

## 2022-11-18 NOTE — ED Triage Notes (Addendum)
Patient to Urgent Care with complaints of nasal/ intermittently productive cough. When she is able to produce sputum she has noticed a pink tinge. Symptoms started on Monday. Endorses a lot of chest congestion. Dyspnea with exertion. Denies any chest pain.   Denies any known fevers. Hx of bronchitis.  RA sats 86-90% during triage. Albuterol neb treatment administered.

## 2022-11-18 NOTE — H&P (Signed)
History and Physical    Emily Jefferson [id number] DOB: October 16, 1960 DOA: 11/18/2022  Referring MD/NP/PA:   PCP: [place] Pink, MD   Patient coming from:  The patient is coming from home.    Chief Complaint: Cough, shortness of breath  HPI: Emily Jefferson is a 62 y.o. female with medical history significant of hypertension, hypothyroidism, morbid obesity, OSA on CPAP, who presents with cough, shortness of breath.  Patient states that she has cough and shortness breath for more than 6 days, which has been progressively worsening.  Patient coughs up little mucus, mostly dry cough.  She has nasal congestion, wheezing, denies chest pain, fever or chills.  Patient does not have nausea, vomiting, diarrhea or abdominal pain.  No symptoms of UTI.  Patient has lightheadedness.  No fall.  Data reviewed independently and ED Course: pt was found to have WBC 6.4,  trop  26 --> 25, BNP 63.2, negative PCR for COVID, flu and RSV, GFR> 60, potassium 3.3.  Temperature normal, blood pressure 137/64, heart rate 103, 84, RR 31, 18, oxygen saturation 88% on room air which improved to 96% on 2 L oxygen (patient is not using oxygen normally).  Chest x-ray negative.  CTA negative for PE, but showed bilateral patchy groundglass opacity and and tree-in-bud opacities. Patient is admitted to telemetry bed as inpatient.  CTA: 1. No evidence for pulmonary embolism. 2. Moderate bilateral patchy ground-glass opacities and tree-in-bud opacities, likely representing pneumonia. Mild bilateral hilar and mediastinal lymphadenopathy is likely reactive.   Aortic Atherosclerosis (ICD10-I70.0).   EKG: I have personally reviewed.    Review of Systems:   General: no fevers, chills, no body weight gain, has fatigue HEENT: no blurry vision, hearing changes or sore throat Respiratory: has dyspnea, coughing, wheezing CV: no chest pain, no palpitations GI: no nausea, vomiting, abdominal pain, diarrhea, constipation GU: no  dysuria, burning on urination, increased urinary frequency, hematuria  Ext: no leg edema Neuro: no unilateral weakness, numbness, or tingling, no vision change or hearing loss Skin: no rash, no skin tear. MSK: No muscle spasm, no deformity, no limitation of range of movement in spin Heme: No easy bruising.  Travel history: No recent long distant travel.   Allergy:  Allergies  Allergen Reactions   Penicillins Rash    Past Medical History:  Diagnosis Date   Allergy    Arthritis    Chicken pox    Dysrhythmia    Gallbladder disease    Hypertension    Sleep apnea    Thyroid disease     Past Surgical History:  Procedure Laterality Date   ABDOMINAL HYSTERECTOMY     CARPAL TUNNEL RELEASE     CHOLECYSTECTOMY     COLONOSCOPY WITH PROPOFOL N/A 07/08/2020   Procedure: COLONOSCOPY WITH PROPOFOL;  Surgeon: Toledo, Benay Pike, MD;  Location: [facility];  Service: Gastroenterology;  Laterality: N/A;   SHOULDER ARTHROSCOPY     TONSILLECTOMY      Social History:  reports that she has never smoked. She has never used smokeless tobacco. She reports current alcohol use. She reports that she does not use drugs.  Family History:  Family History  Problem Relation Age of Onset   Heart disease Mother    Heart disease Father    Hypertension Sister    Diabetes Brother    Hypertension Brother    Breast cancer Neg Hx      Prior to Admission medications   Medication Sig Start Date End Date Taking? Authorizing Provider  ARMOUR THYROID  180 MG tablet Take 180 mg by mouth daily.    [provider]  Azelastine HCl 137 MCG/SPRAY SOLN Place 1 spray into both nostrils. Use in each nostril as directed Patient not taking: Reported on 09/04/2022    [provider]  cholecalciferol (VITAMIN D3) 10 MCG (400 UNIT) TABS tablet Take 400 Units by mouth 2 (two) times daily.    [provider]  COVID-19 mRNA bivalent vaccine, Pfizer, (PFIZER COVID-19 VAC BIVALENT) injection Inject  into the muscle. Patient not taking: Reported on 09/04/2022 06/10/21   Carlyle Basques, MD  glucosamine-chondroitin 500-400 MG tablet Take 1 tablet by mouth 3 (three) times daily. Patient not taking: Reported on 09/04/2022    [provider]  levocetirizine (XYZAL) 5 MG tablet Take 5 mg by mouth every evening.    [provider]  levothyroxine (SYNTHROID) 137 MCG tablet Take 137 mcg by mouth daily before breakfast. Patient not taking: Reported on 09/04/2022    [provider]  liothyronine (CYTOMEL) 5 MCG tablet Take by mouth. 06/07/22   [provider]  losartan-hydrochlorothiazide (HYZAAR) 100-25 MG tablet Take 1 tablet by mouth daily.    [provider]  meloxicam (MOBIC) 7.5 MG tablet Take 1 tablet by mouth 2 (two) times daily. 09/13/20   [provider]  montelukast (SINGULAIR) 10 MG tablet Take 10 mg by mouth at bedtime.    [provider]  Multiple Vitamin (MULTIVITAMIN) capsule Take 1 capsule by mouth daily.    [provider]    Physical Exam: Vitals:   11/18/22 1530 11/18/22 1600 11/18/22 1616 11/18/22 1637  BP: (!) 121/55 128/86  (!) 151/80  Pulse: 75 80  84  Resp: (!) 27 (!) 23  17  Temp:   98.9 F (37.2 C) 98.6 F (37 C)  TempSrc:   Oral Oral  SpO2: 94%   92%  Weight:    (!) 142.6 kg  Height:    5\' 5"  (1.651 m)   General: Not in acute distress HEENT:       Eyes: PERRL, EOMI, no scleral icterus.       ENT: No discharge from the ears and nose, no pharynx injection, no tonsillar enlargement.        Neck: No JVD, no bruit, no mass felt. Heme: No neck lymph node enlargement. Cardiac: S1/S2, RRR, No murmurs, No gallops or rubs. Respiratory: has wheezing bilaterally GI: Soft, nondistended, nontender, no rebound pain, no organomegaly, BS present. GU: No hematuria Ext: No pitting leg edema bilaterally. 1+DP/PT pulse bilaterally. Musculoskeletal: No joint deformities, No joint redness or warmth, no limitation  of ROM in spin. Skin: No rashes.  Neuro: Alert, oriented X3, cranial nerves II-XII grossly intact, moves all extremities normally Psych: Patient is not psychotic, no suicidal or hemocidal ideation.  Labs on Admission: I have personally reviewed following labs and imaging studies  CBC: Recent Labs  Lab 11/18/22 1249  WBC 6.4  NEUTROABS 3.6  HGB 15.9*  HCT 46.9*  MCV 89.3  PLT [id number]   Basic Metabolic Panel: Recent Labs  Lab 11/18/22 1249 11/18/22 1426  NA 138  --   K 3.3*  --   CL 105  --   CO2 22  --   GLUCOSE 121*  --   BUN 16  --   CREATININE 0.75  --   CALCIUM 8.4*  --   MG  --  2.1   GFR: Estimated Creatinine Clearance: 107.7 mL/min (by C-G formula based on SCr of 0.75  mg/dL). Liver Function Tests: Recent Labs  Lab 11/18/22 1249  AST 49*  ALT 28  ALKPHOS 83  BILITOT 0.4  PROT 8.4*  ALBUMIN 4.2   No results for input(s): "LIPASE", "AMYLASE" in the last 168 hours. No results for input(s): "AMMONIA" in the last 168 hours. Coagulation Profile: No results for input(s): "INR", "PROTIME" in the last 168 hours. Cardiac Enzymes: No results for input(s): "CKTOTAL", "CKMB", "CKMBINDEX", "TROPONINI" in the last 168 hours. BNP (last 3 results) No results for input(s): "PROBNP" in the last 8760 hours. HbA1C: No results for input(s): "HGBA1C" in the last 72 hours. CBG: No results for input(s): "GLUCAP" in the last 168 hours. Lipid Profile: No results for input(s): "CHOL", "HDL", "LDLCALC", "TRIG", "CHOLHDL", "LDLDIRECT" in the last 72 hours. Thyroid Function Tests: No results for input(s): "TSH", "T4TOTAL", "FREET4", "T3FREE", "THYROIDAB" in the last 72 hours. Anemia Panel: No results for input(s): "VITAMINB12", "FOLATE", "FERRITIN", "TIBC", "IRON", "RETICCTPCT" in the last 72 hours. Urine analysis: No results found for: "COLORURINE", "APPEARANCEUR", "LABSPEC", "PHURINE", "GLUCOSEU", "HGBUR", "BILIRUBINUR", "KETONESUR", "PROTEINUR", "UROBILINOGEN", "NITRITE",  "LEUKOCYTESUR" Sepsis Labs: @LABRCNTIP (procalcitonin:4,lacticidven:4) ) Recent Results (from the past 240 hour(s))  Resp panel by RT-PCR (RSV, Flu A&B, Covid) Anterior Nasal Swab     Status: None   Collection Time: 11/18/22 12:48 PM   Specimen: Anterior Nasal Swab  Result Value Ref Range Status   SARS Coronavirus 2 by RT PCR NEGATIVE NEGATIVE Final    Comment: (NOTE) SARS-CoV-2 target nucleic acids are NOT DETECTED.  The SARS-CoV-2 RNA is generally detectable in upper respiratory specimens during the acute phase of infection. The lowest concentration of SARS-CoV-2 viral copies this assay can detect is 138 copies/mL. A negative result does not preclude SARS-Cov-2 infection and should not be used as the sole basis for treatment or other patient management decisions. A negative result may occur with  improper specimen collection/handling, submission of specimen other than nasopharyngeal swab, presence of viral mutation(s) within the areas targeted by this assay, and inadequate number of viral copies(<138 copies/mL). A negative result must be combined with clinical observations, patient history, and epidemiological information. The expected result is Negative.  Fact Sheet for Patients:  EntrepreneurPulse.com.au  Fact Sheet for Healthcare Providers:  IncredibleEmployment.be  This test is no t yet approved or cleared by the [place] FDA and  has been authorized for detection and/or diagnosis of SARS-CoV-2 by FDA under an Emergency Use Authorization (EUA). This EUA will remain  in effect (meaning this test can be used) for the duration of the COVID-19 declaration under Section 564(b)(1) of the Act, 21 U.S.C.section 360bbb-3(b)(1), unless the authorization is terminated  or revoked sooner.       Influenza A by PCR NEGATIVE NEGATIVE Final   Influenza B by PCR NEGATIVE NEGATIVE Final    Comment: (NOTE) The Xpert Xpress SARS-CoV-2/FLU/RSV plus  assay is intended as an aid in the diagnosis of influenza from Nasopharyngeal swab specimens and should not be used as a sole basis for treatment. Nasal washings and aspirates are unacceptable for Xpert Xpress SARS-CoV-2/FLU/RSV testing.  Fact Sheet for Patients: EntrepreneurPulse.com.au  Fact Sheet for Healthcare Providers: IncredibleEmployment.be  This test is not yet approved or cleared by the [place] FDA and has been authorized for detection and/or diagnosis of SARS-CoV-2 by FDA under an Emergency Use Authorization (EUA). This EUA will remain in effect (meaning this test can be used) for the duration of the COVID-19 declaration under Section 564(b)(1) of the Act, 21 U.S.C. section 360bbb-3(b)(1), unless the authorization is  terminated or revoked.     Resp Syncytial Virus by PCR NEGATIVE NEGATIVE Final    Comment: (NOTE) Fact Sheet for Patients: EntrepreneurPulse.com.au  Fact Sheet for Healthcare Providers: IncredibleEmployment.be  This test is not yet approved or cleared by the [place] FDA and has been authorized for detection and/or diagnosis of SARS-CoV-2 by FDA under an Emergency Use Authorization (EUA). This EUA will remain in effect (meaning this test can be used) for the duration of the COVID-19 declaration under Section 564(b)(1) of the Act, 21 U.S.C. section 360bbb-3(b)(1), unless the authorization is terminated or revoked.  Performed at [facility], [address], [place], [place] [address]      Radiological Exams on Admission: CT Angio Chest PE W and/or Wo Contrast  Result Date: 11/18/2022 CLINICAL DATA:  Cough and chest congestion. EXAM: CT ANGIOGRAPHY CHEST WITH CONTRAST TECHNIQUE: Multidetector CT imaging of the chest was performed using the standard protocol during bolus administration of intravenous contrast. Multiplanar CT image reconstructions and MIPs were  obtained to evaluate the vascular anatomy. RADIATION DOSE REDUCTION: This exam was performed according to the departmental dose-optimization program which includes automated exposure control, adjustment of the mA and/or kV according to patient size and/or use of iterative reconstruction technique. CONTRAST:  69mL OMNIPAQUE IOHEXOL 350 MG/ML SOLN COMPARISON:  Chest radiograph dated 11/18/2022. FINDINGS: Cardiovascular: Satisfactory opacification of the pulmonary arteries to the segmental level. No evidence of pulmonary embolism. Vascular calcifications are seen in the aortic arch. Normal heart size. No pericardial effusion. Mediastinum/Nodes: There is mild bilateral hilar and mediastinal lymphadenopathy. No axillary lymphadenopathy. The thyroid gland, trachea, and esophagus demonstrate no significant abnormality. Lungs/Pleura: There is moderate bilateral patchy ground-glass opacities and tree-in-bud opacities. There is mild left upper lobe atelectasis. No pleural effusion or pneumothorax. Upper Abdomen: There is colonic diverticulosis without evidence of diverticulitis. Gallbladder is surgically absent. Musculoskeletal: Degenerative changes are seen in the spine. Review of the MIP images confirms the above findings. IMPRESSION: 1. No evidence for pulmonary embolism. 2. Moderate bilateral patchy ground-glass opacities and tree-in-bud opacities, likely representing pneumonia. Mild bilateral hilar and mediastinal lymphadenopathy is likely reactive. Aortic Atherosclerosis (ICD10-I70.0). Electronically Signed   By: Zerita Boers M.D.   On: 11/18/2022 14:59   DG Chest 2 View  Result Date: 11/18/2022 CLINICAL DATA:  SOB EXAM: CHEST - 2 VIEW COMPARISON:  None available. FINDINGS: The heart size and mediastinal contours are within normal limits. Both lungs are clear. No pneumothorax or pleural effusion. Aorta calcified. There are thoracic degenerative changes. IMPRESSION: No active cardiopulmonary disease. Electronically  Signed   By: Sammie Bench M.D.   On: 11/18/2022 13:19      Assessment/Plan Principal Problem:   CAP (community acquired pneumonia) Active Problems:   Myocardial injury   Hypertension   Hypothyroidism   Sleep apnea   Hypokalemia   Morbid obesity with BMI of 50.0-59.9, adult ([place])   Assessment and Plan:  CAP (community acquired pneumonia): Patient has 2 L new oxygen requirement.  CTA negative for PE, but showed bilateral patchy groundglass infiltration, indicating possible pneumonia, however patient does not have fever and leukocytosis.  Viral pneumonia is also possible.  Negative PCR for COVID, flu and RSV.  Patient has wheezing, indicating bronchospasm.  Will use steroid.  - Will admit to tele bed as inpt - IV Rocephin and azithromycin - Solu-Medrol 80 mg twice daily - Mucinex for cough  - Bronchodilators - Urine legionella and S. pneumococcal antigen - Follow up blood culture x2, sputum culture - Check  respiratory virus panel  Myocardial injury: trop  26 --> 25. No CP. -Start aspirin 81 mg daily -Check A1c, FLP  Hypertension -IV hydralazine as needed -Hyzaar  Hypothyroidism -Continue home Armour thyroid and liothyronine  Sleep apnea -CPAP  Hypokalemia: Potassium 3.3 -Repleted potassium -Check magnesium level --> 2.1  Morbid obesity with BMI of 50.0-59.9, adult ([place]): Body weight 142.6 kg, BMI 52.31 -Encouraged losing weight -Healthy diet and exercise     DVT ppx:   SQ Lovenox  Code Status: DNR (I discussed with patient and explained the meaning of CODE STATUS. Patient is very sure that she wants to be DNR. I confirmed with her twice).  Family Communication: I offered to call her family, but patient states that her husband is coming to see her, she does not want me to call her family.  Disposition Plan:  Anticipate discharge back to previous environment  Consults called:  none  Admission status and Level of care: Telemetry Medical:    as inpt        Dispo: The patient is from: Home              Anticipated d/c is to: Home              Anticipated d/c date is: 2 days              Patient currently is not medically stable to d/c.    Severity of Illness:  The appropriate patient status for this patient is INPATIENT. Inpatient status is judged to be reasonable and necessary in order to provide the required intensity of service to ensure the patient's safety. The patient's presenting symptoms, physical exam findings, and initial radiographic and laboratory data in the context of their chronic comorbidities is felt to place them at high risk for further clinical deterioration. Furthermore, it is not anticipated that the patient will be medically stable for discharge from the hospital within 2 midnights of admission.   * I certify that at the point of admission it is my clinical judgment that the patient will require inpatient hospital care spanning beyond 2 midnights from the point of admission due to high intensity of service, high risk for further deterioration and high frequency of surveillance required.*       Date of Service 11/18/2022    Ivor [facility]   If 7PM-7AM, please contact night-coverage [email or web address] 11/18/2022, 6:30 PM

## 2022-11-18 NOTE — ED Provider Notes (Signed)
Roderic Palau    CSN: [id number] Arrival date & time: 11/18/22  1141      History   Chief Complaint Chief Complaint  Patient presents with   Nasal Congestion    severe nasal and chest Congestion, coughing - Entered by patient    HPI Emily Jefferson is a 62 y.o. female. Patient presents with 5 day history of congestion and cough.  Her cough is productive of pink-tinged sputum.  She feels short of breath with exertion.  No fever, chest pain, focal weakness, or other symptoms.  No OTC medications at home.  She denies history of lung disease.  Her medical history includes allergies, dysrhythmia, sleep apnea, hypertension, hypothyroidism.    The history is provided by the patient and medical records.    Past Medical History:  Diagnosis Date   Allergy    Arthritis    Chicken pox    Dysrhythmia    Gallbladder disease    Hypertension    Sleep apnea    Thyroid disease     There are no problems to display for this patient.   Past Surgical History:  Procedure Laterality Date   ABDOMINAL HYSTERECTOMY     CARPAL TUNNEL RELEASE     CHOLECYSTECTOMY     COLONOSCOPY WITH PROPOFOL N/A 07/08/2020   Procedure: COLONOSCOPY WITH PROPOFOL;  Surgeon: Toledo, Benay Pike, MD;  Location: [facility] ENDOSCOPY;  Service: Gastroenterology;  Laterality: N/A;   SHOULDER ARTHROSCOPY     TONSILLECTOMY      OB History   No obstetric history on file.      Home Medications    Prior to Admission medications   Medication Sig Start Date End Date Taking? Authorizing Provider  ARMOUR THYROID 180 MG tablet Take 180 mg by mouth daily.    [provider]  Azelastine HCl 137 MCG/SPRAY SOLN Place 1 spray into both nostrils. Use in each nostril as directed Patient not taking: Reported on 09/04/2022    [provider]  cholecalciferol (VITAMIN D3) 10 MCG (400 UNIT) TABS tablet Take 400 Units by mouth 2 (two) times daily.    [provider]  COVID-19 mRNA bivalent vaccine, Pfizer,  (PFIZER COVID-19 VAC BIVALENT) injection Inject into the muscle. Patient not taking: Reported on 09/04/2022 06/10/21   Carlyle Basques, MD  glucosamine-chondroitin 500-400 MG tablet Take 1 tablet by mouth 3 (three) times daily. Patient not taking: Reported on 09/04/2022    [provider]  levocetirizine (XYZAL) 5 MG tablet Take 5 mg by mouth every evening.    [provider]  levothyroxine (SYNTHROID) 137 MCG tablet Take 137 mcg by mouth daily before breakfast. Patient not taking: Reported on 09/04/2022    [provider]  liothyronine (CYTOMEL) 5 MCG tablet Take by mouth. 06/07/22   [provider]  losartan-hydrochlorothiazide (HYZAAR) 100-25 MG tablet Take 1 tablet by mouth daily.    [provider]  meloxicam (MOBIC) 7.5 MG tablet Take 1 tablet by mouth 2 (two) times daily. 09/13/20   [provider]  montelukast (SINGULAIR) 10 MG tablet Take 10 mg by mouth at bedtime.    [provider]  Multiple Vitamin (MULTIVITAMIN) capsule Take 1 capsule by mouth daily.    [provider]    Family History Family History  Problem Relation Age of Onset   Breast cancer Neg Hx     Social History Social History   Tobacco Use   Smoking status: Never   Smokeless tobacco: Never  Vaping Use  Vaping Use: Never used  Substance Use Topics   Alcohol use: Yes   Drug use: Never     Allergies   Penicillins   Review of Systems Review of Systems  Constitutional:  Negative for chills and fever.  HENT:  Positive for congestion. Negative for ear pain and sore throat.   Respiratory:  Positive for cough and shortness of breath.   Cardiovascular:  Negative for chest pain and palpitations.  All other systems reviewed and are negative.    Physical Exam Triage Vital Signs ED Triage Vitals [11/18/22 1209]  Enc Vitals Group     BP      Pulse Rate 86     Resp 20     Temp 98.3 F (36.8 C)     Temp src      SpO2 90 %     Weight       Height      Head Circumference      Peak Flow      Pain Score      Pain Loc      Pain Edu?      Excl. in [place]?    No data found.  Updated Vital Signs BP 112/71   Pulse 95   Temp 98.3 F (36.8 C)   Resp (!) 22   SpO2 93%   Visual Acuity Right Eye Distance:   Left Eye Distance:   Bilateral Distance:    Right Eye Near:   Left Eye Near:    Bilateral Near:     Physical Exam Vitals and nursing note reviewed.  Constitutional:      General: She is not in acute distress.    Appearance: She is well-developed. She is obese. She is not ill-appearing.  HENT:     Mouth/Throat:     Mouth: Mucous membranes are moist.  Cardiovascular:     Rate and Rhythm: Normal rate and regular rhythm.     Heart sounds: Normal heart sounds.  Pulmonary:     Effort: Pulmonary effort is normal. No respiratory distress.     Breath sounds: Wheezing present.  Musculoskeletal:     Cervical back: Neck supple.  Skin:    General: Skin is warm and dry.  Neurological:     Mental Status: She is alert.  Psychiatric:        Mood and Affect: Mood normal.        Behavior: Behavior normal.      UC Treatments / Results  Labs (all labs ordered are listed, but only abnormal results are displayed) Labs Reviewed - No data to display  EKG   Radiology No results found.  Procedures Procedures (including critical care time)  Medications Ordered in UC Medications  albuterol (PROVENTIL) (2.5 MG/3ML) 0.083% nebulizer solution 2.5 mg (2.5 mg Nebulization Given 11/18/22 1218)    Initial Impression / Assessment and Plan / UC Course  I have reviewed the triage vital signs and the nursing notes.  Pertinent labs & imaging results that were available during my care of the patient were reviewed by me and considered in my medical decision making (see chart for details).   Hypoxia, shortness of breath, cough.  Upon arrival, patient's O2 sat 86-90% on room air.  No history of lung disease.  Albuterol nebulizer  treatment given here and O2 sat improved to 91-93% on room air after treatment ended.  Sending her to the ED for evaluation.  She declines EMS and states her husband will drive her to  the ED.     Final Clinical Impressions(s) / UC Diagnoses   Final diagnoses:  Hypoxia  Shortness of breath  Acute cough     Discharge Instructions      Go to the emergency department for evaluation of your low oxygen level, cough, and shortness of breath.       ED Prescriptions   None    PDMP not reviewed this encounter.   Sharion Balloon, NP 11/18/22 1229

## 2022-11-18 NOTE — Discharge Instructions (Signed)
Go to the emergency department for evaluation of your low oxygen level, cough, and shortness of breath.

## 2022-11-18 NOTE — ED Notes (Signed)
Pt taken by xray tech- husband in lobby

## 2022-11-19 DIAGNOSIS — J123 Human metapneumovirus pneumonia: Secondary | ICD-10-CM | POA: Diagnosis not present

## 2022-11-19 DIAGNOSIS — B348 Other viral infections of unspecified site: Secondary | ICD-10-CM | POA: Diagnosis not present

## 2022-11-19 LAB — LIPID PANEL
Cholesterol: 108 mg/dL (ref 0–200)
HDL: 37 mg/dL — ABNORMAL LOW (ref >40)
LDL Cholesterol: 58 mg/dL (ref 0–99)
Total CHOL/HDL Ratio: 2.9 RATIO
Triglycerides: 66 mg/dL (ref <150)
VLDL: 13 mg/dL (ref 0–40)

## 2022-11-19 LAB — BASIC METABOLIC PANEL
Anion gap: 5 (ref 5–15)
BUN: 17 mg/dL (ref 6–20)
CO2: 25 mmol/L (ref 22–32)
Calcium: 8.4 mg/dL — ABNORMAL LOW (ref 8.9–10.3)
Chloride: 109 mmol/L (ref 98–111)
Creatinine, Ser: 0.67 mg/dL (ref 0.44–1.00)
GFR, Estimated: >60 mL/min (ref >60)
Glucose, Bld: 183 mg/dL — ABNORMAL HIGH (ref 70–99)
Potassium: 4 mmol/L (ref 3.5–5.1)
Sodium: 139 mmol/L (ref 135–145)

## 2022-11-19 LAB — RESPIRATORY PANEL BY PCR

## 2022-11-19 LAB — PROCALCITONIN: Procalcitonin: <0.1 ng/mL

## 2022-11-19 LAB — CBC
HCT: 43.6 % (ref 36.0–46.0)
Hemoglobin: 14.6 g/dL (ref 12.0–15.0)
MCH: 30.4 pg (ref 26.0–34.0)
MCHC: 33.5 g/dL (ref 30.0–36.0)
MCV: 90.8 fL (ref 80.0–100.0)
Platelets: 164 10*3/uL (ref 150–400)
RBC: 4.8 MIL/uL (ref 3.87–5.11)
RDW: 13.4 % (ref 11.5–15.5)
WBC: 2.7 10*3/uL — ABNORMAL LOW (ref 4.0–10.5)
nRBC: 0 % (ref 0.0–0.2)

## 2022-11-19 LAB — STREP PNEUMONIAE URINARY ANTIGEN: Strep Pneumo Urinary Antigen: NEGATIVE

## 2022-11-19 LAB — HIV ANTIBODY (ROUTINE TESTING W REFLEX): HIV Screen 4th Generation wRfx: NONREACTIVE

## 2022-11-19 MED ORDER — METHYLPREDNISOLONE SODIUM SUCC 125 MG IJ SOLR
60.0000 mg | Freq: Two times a day (BID) | INTRAMUSCULAR | Status: DC
  Administered 2022-11-19 – 2022-11-20 (×2): 60 mg via INTRAVENOUS
  Filled 2022-11-19 (×2): qty 2

## 2022-11-19 MED ORDER — IPRATROPIUM-ALBUTEROL 0.5-2.5 (3) MG/3ML IN SOLN
3.0000 mL | Freq: Three times a day (TID) | RESPIRATORY_TRACT | Status: DC
  Administered 2022-11-19 – 2022-11-20 (×2): 3 mL via RESPIRATORY_TRACT
  Filled 2022-11-19 (×2): qty 3

## 2022-11-19 MED ORDER — ORAL CARE MOUTH RINSE: 15.0000 mL | OROMUCOSAL | Status: DC | PRN

## 2022-11-19 NOTE — Progress Notes (Addendum)
PROGRESS NOTE    Emily Jefferson  [id number] DOB: Nov 12, 1960 DOA: 11/18/2022 PCP: [place] Pink, MD    Brief Narrative:  62 y.o. female with medical history significant of hypertension, hypothyroidism, morbid obesity, OSA on CPAP, who presents with cough, shortness of breath.   Patient states that she has cough and shortness breath for more than 6 days, which has been progressively worsening.  Patient coughs up little mucus, mostly dry cough.  She has nasal congestion, wheezing, denies chest pain, fever or chills.  Patient does not have nausea, vomiting, diarrhea or abdominal pain.   Started on intravenous steroids for presumed bronchospasm and respiratory coverage antibiotics for presumed CAP.  Respiratory viral panel positive for human metapneumovirus.  Procalcitonin negative.   Assessment & Plan:   Principal Problem:   CAP (community acquired pneumonia) Active Problems:   Myocardial injury   Hypertension   Hypothyroidism   Sleep apnea   Hypokalemia   Morbid obesity with BMI of 50.0-59.9, adult (HCC)  Pneumonia due to human metapneumovirus Hypoxemia CTA negative for PE.  Bilateral patchy groundglass infiltration.  Procalcitonin negative.  No fever, no leukocytosis.  Suspect viral pneumonia.  Also has some wheezing however improving. Plan: Discontinue Rocephin Continue azithromycin x 3 days for anti-inflammatory effect IV steroid + Solu-Medrol 60 mg twice daily Mucolytic's and bronchodilators Wean oxygen as tolerated Monitor vitals and fever curve  Elevated troponin No significant delta.  No chest pain.  DC aspirin.  Check hemoglobin A1c.  Pending.  Essential hypertension Continue home Hyzaar.  Hydralazine as needed  Hypothyroidism PTA thyroid regimen  Sleep apnea Nightly CPAP  Hypokalemia Monitor and replace as necessary  Morbid obesity BMI 52.  This complicates overall care and prognosis   DVT prophylaxis: SQ Lovenox Code Status: DNR Family  Communication: Spouse bedside Disposition Plan: Status is: Inpatient Remains inpatient appropriate because: Viral pneumonia on IV steroids.  Not hypoxic.  Anticipate discharge 3/25.   Level of care: Telemetry Medical  Consultants:  None  Procedures:  None  Antimicrobials: Azithromycin   Subjective: Seen and examined.  Resting in bed.  No visible distress.  No complaints of pain.  Objective: Vitals:   11/19/22 0154 11/19/22 0738 11/19/22 0801 11/19/22 1307  BP:   135/67   Pulse:   74   Resp:   16   Temp:      TempSrc:      SpO2: 95% 97% 94% 97%  Weight:      Height:        Intake/Output Summary (Last 24 hours) at 11/19/2022 1359 Last data filed at 11/19/2022 0801 Gross per 24 hour  Intake 100 ml  Output 200 ml  Net -100 ml   Filed Weights   11/18/22 1637  Weight: (!) 142.6 kg    Examination:  General exam: Appears calm and comfortable  Respiratory system: Mild end expiratory wheeze.  Normal work of breathing.  1 L Cardiovascular system: S1-S2, RRR, no murmurs, no pedal edema Gastrointestinal system: Obese, soft, NT/ND, normal bowel sounds Central nervous system: Alert and oriented. No focal neurological deficits. Extremities: Symmetric 5 x 5 power. Skin: No rashes, lesions or ulcers Psychiatry: Judgement and insight appear normal. Mood & affect appropriate.     Data Reviewed: I have personally reviewed following labs and imaging studies  CBC: Recent Labs  Lab 11/18/22 1249 11/19/22 0334  WBC 6.4 2.7*  NEUTROABS 3.6  --   HGB 15.9* 14.6  HCT 46.9* 43.6  MCV 89.3 90.8  PLT 198 164  Basic Metabolic Panel: Recent Labs  Lab 11/18/22 1249 11/18/22 1426 11/19/22 0334  NA 138  --  139  K 3.3*  --  4.0  CL 105  --  109  CO2 22  --  25  GLUCOSE 121*  --  183*  BUN 16  --  17  CREATININE 0.75  --  0.67  CALCIUM 8.4*  --  8.4*  MG  --  2.1  --    GFR: Estimated Creatinine Clearance: 107.7 mL/min (by C-G formula based on SCr of 0.67  mg/dL). Liver Function Tests: Recent Labs  Lab 11/18/22 1249  AST 49*  ALT 28  ALKPHOS 83  BILITOT 0.4  PROT 8.4*  ALBUMIN 4.2   No results for input(s): "LIPASE", "AMYLASE" in the last 168 hours. No results for input(s): "AMMONIA" in the last 168 hours. Coagulation Profile: No results for input(s): "INR", "PROTIME" in the last 168 hours. Cardiac Enzymes: No results for input(s): "CKTOTAL", "CKMB", "CKMBINDEX", "TROPONINI" in the last 168 hours. BNP (last 3 results) No results for input(s): "PROBNP" in the last 8760 hours. HbA1C: No results for input(s): "HGBA1C" in the last 72 hours. CBG: No results for input(s): "GLUCAP" in the last 168 hours. Lipid Profile: Recent Labs    11/19/22 0334  CHOL 108  HDL 37*  LDLCALC 58  TRIG 66  CHOLHDL 2.9   Thyroid Function Tests: No results for input(s): "TSH", "T4TOTAL", "FREET4", "T3FREE", "THYROIDAB" in the last 72 hours. Anemia Panel: No results for input(s): "VITAMINB12", "FOLATE", "FERRITIN", "TIBC", "IRON", "RETICCTPCT" in the last 72 hours. Sepsis Labs: Recent Labs  Lab 11/19/22 0333  PROCALCITON <0.10    Recent Results (from the past 240 hour(s))  Resp panel by RT-PCR (RSV, Flu A&B, Covid) Anterior Nasal Swab     Status: None   Collection Time: 11/18/22 12:48 PM   Specimen: Anterior Nasal Swab  Result Value Ref Range Status   SARS Coronavirus 2 by RT PCR NEGATIVE NEGATIVE Final    Comment: (NOTE) SARS-CoV-2 target nucleic acids are NOT DETECTED.  The SARS-CoV-2 RNA is generally detectable in upper respiratory specimens during the acute phase of infection. The lowest concentration of SARS-CoV-2 viral copies this assay can detect is 138 copies/mL. A negative result does not preclude SARS-Cov-2 infection and should not be used as the sole basis for treatment or other patient management decisions. A negative result may occur with  improper specimen collection/handling, submission of specimen other than  nasopharyngeal swab, presence of viral mutation(s) within the areas targeted by this assay, and inadequate number of viral copies(<138 copies/mL). A negative result must be combined with clinical observations, patient history, and epidemiological information. The expected result is Negative.  Fact Sheet for Patients:  EntrepreneurPulse.com.au  Fact Sheet for Healthcare Providers:  IncredibleEmployment.be  This test is no t yet approved or cleared by the [place] FDA and  has been authorized for detection and/or diagnosis of SARS-CoV-2 by FDA under an Emergency Use Authorization (EUA). This EUA will remain  in effect (meaning this test can be used) for the duration of the COVID-19 declaration under Section 564(b)(1) of the Act, 21 U.S.C.section 360bbb-3(b)(1), unless the authorization is terminated  or revoked sooner.       Influenza A by PCR NEGATIVE NEGATIVE Final   Influenza B by PCR NEGATIVE NEGATIVE Final    Comment: (NOTE) The Xpert Xpress SARS-CoV-2/FLU/RSV plus assay is intended as an aid in the diagnosis of influenza from Nasopharyngeal swab specimens and should not be used  as a sole basis for treatment. Nasal washings and aspirates are unacceptable for Xpert Xpress SARS-CoV-2/FLU/RSV testing.  Fact Sheet for Patients: EntrepreneurPulse.com.au  Fact Sheet for Healthcare Providers: IncredibleEmployment.be  This test is not yet approved or cleared by the [place] FDA and has been authorized for detection and/or diagnosis of SARS-CoV-2 by FDA under an Emergency Use Authorization (EUA). This EUA will remain in effect (meaning this test can be used) for the duration of the COVID-19 declaration under Section 564(b)(1) of the Act, 21 U.S.C. section 360bbb-3(b)(1), unless the authorization is terminated or revoked.     Resp Syncytial Virus by PCR NEGATIVE NEGATIVE Final    Comment:  (NOTE) Fact Sheet for Patients: EntrepreneurPulse.com.au  Fact Sheet for Healthcare Providers: IncredibleEmployment.be  This test is not yet approved or cleared by the [place] FDA and has been authorized for detection and/or diagnosis of SARS-CoV-2 by FDA under an Emergency Use Authorization (EUA). This EUA will remain in effect (meaning this test can be used) for the duration of the COVID-19 declaration under Section 564(b)(1) of the Act, 21 U.S.C. section 360bbb-3(b)(1), unless the authorization is terminated or revoked.  Performed at [facility], [address] [place], [place] [address]   Culture, blood (routine x 2) Call MD if unable to obtain prior to antibiotics being given     Status: None (Preliminary result)   Collection Time: 11/18/22  4:59 PM   Specimen: BLOOD  Result Value Ref Range Status   Specimen Description BLOOD BLOOD RIGHT HAND  Final   Special Requests   Final    BOTTLES DRAWN AEROBIC AND ANAEROBIC Blood Culture adequate volume   Culture   Final    NO GROWTH < 12 HOURS Performed at [facility], [address], [place], [place] [address]    Report Status PENDING  Incomplete  Culture, blood (routine x 2) Call MD if unable to obtain prior to antibiotics being given     Status: None (Preliminary result)   Collection Time: 11/18/22  5:07 PM   Specimen: BLOOD  Result Value Ref Range Status   Specimen Description BLOOD BLOOD LEFT HAND  Final   Special Requests   Final    BOTTLES DRAWN AEROBIC AND ANAEROBIC Blood Culture adequate volume   Culture   Final    NO GROWTH < 12 HOURS Performed at [facility], [address] [place], [place] [address]    Report Status PENDING  Incomplete  Respiratory (~20 pathogens) panel by PCR     Status: Abnormal   Collection Time: 11/18/22 11:00 PM  Result Value Ref Range Status   Adenovirus NOT DETECTED NOT DETECTED Final   Coronavirus 229E NOT  DETECTED NOT DETECTED Final    Comment: (NOTE) The Coronavirus on the Respiratory Panel, DOES NOT test for the novel  Coronavirus (2019 nCoV)    Coronavirus HKU1 NOT DETECTED NOT DETECTED Final   Coronavirus NL63 NOT DETECTED NOT DETECTED Final   Coronavirus OC43 NOT DETECTED NOT DETECTED Final   Metapneumovirus DETECTED (A) NOT DETECTED Final   Rhinovirus / Enterovirus NOT DETECTED NOT DETECTED Final   Influenza A NOT DETECTED NOT DETECTED Final   Influenza B NOT DETECTED NOT DETECTED Final   Parainfluenza Virus 1 NOT DETECTED NOT DETECTED Final   Parainfluenza Virus 2 NOT DETECTED NOT DETECTED Final   Parainfluenza Virus 3 NOT DETECTED NOT DETECTED Final   Parainfluenza Virus 4 NOT DETECTED NOT DETECTED Final   Respiratory Syncytial Virus NOT DETECTED NOT DETECTED  Final   Bordetella pertussis NOT DETECTED NOT DETECTED Final   Bordetella Parapertussis NOT DETECTED NOT DETECTED Final   Chlamydophila pneumoniae NOT DETECTED NOT DETECTED Final   Mycoplasma pneumoniae NOT DETECTED NOT DETECTED Final    Comment: Performed at [facility], [place] [address], [place], [place] [address]         Radiology Studies: CT Angio Chest PE W and/or Wo Contrast  Result Date: 11/18/2022 CLINICAL DATA:  Cough and chest congestion. EXAM: CT ANGIOGRAPHY CHEST WITH CONTRAST TECHNIQUE: Multidetector CT imaging of the chest was performed using the standard protocol during bolus administration of intravenous contrast. Multiplanar CT image reconstructions and MIPs were obtained to evaluate the vascular anatomy. RADIATION DOSE REDUCTION: This exam was performed according to the departmental dose-optimization program which includes automated exposure control, adjustment of the mA and/or kV according to patient size and/or use of iterative reconstruction technique. CONTRAST:  44mL OMNIPAQUE IOHEXOL 350 MG/ML SOLN COMPARISON:  Chest radiograph dated 11/18/2022. FINDINGS: Cardiovascular: Satisfactory  opacification of the pulmonary arteries to the segmental level. No evidence of pulmonary embolism. Vascular calcifications are seen in the aortic arch. Normal heart size. No pericardial effusion. Mediastinum/Nodes: There is mild bilateral hilar and mediastinal lymphadenopathy. No axillary lymphadenopathy. The thyroid gland, trachea, and esophagus demonstrate no significant abnormality. Lungs/Pleura: There is moderate bilateral patchy ground-glass opacities and tree-in-bud opacities. There is mild left upper lobe atelectasis. No pleural effusion or pneumothorax. Upper Abdomen: There is colonic diverticulosis without evidence of diverticulitis. Gallbladder is surgically absent. Musculoskeletal: Degenerative changes are seen in the spine. Review of the MIP images confirms the above findings. IMPRESSION: 1. No evidence for pulmonary embolism. 2. Moderate bilateral patchy ground-glass opacities and tree-in-bud opacities, likely representing pneumonia. Mild bilateral hilar and mediastinal lymphadenopathy is likely reactive. Aortic Atherosclerosis (ICD10-I70.0). Electronically Signed   By: Zerita Boers M.D.   On: 11/18/2022 14:59   DG Chest 2 View  Result Date: 11/18/2022 CLINICAL DATA:  SOB EXAM: CHEST - 2 VIEW COMPARISON:  None available. FINDINGS: The heart size and mediastinal contours are within normal limits. Both lungs are clear. No pneumothorax or pleural effusion. Aorta calcified. There are thoracic degenerative changes. IMPRESSION: No active cardiopulmonary disease. Electronically Signed   By: Sammie Bench M.D.   On: 11/18/2022 13:19        Scheduled Meds:  azithromycin  500 mg Oral Daily   enoxaparin (LOVENOX) injection  0.5 mg/kg Subcutaneous Q24H   losartan  100 mg Oral Daily   And   hydrochlorothiazide  25 mg Oral Daily   ipratropium-albuterol  3 mL Nebulization Q6H   liothyronine  10 mcg Oral Daily   loratadine  10 mg Oral QPM   methylPREDNISolone (SOLU-MEDROL) injection  60 mg  Intravenous Q12H   montelukast  10 mg Oral QHS   multivitamin with minerals  1 tablet Oral Daily   thyroid  180 mg Oral QODAY   thyroid  90 mg Oral QODAY   Continuous Infusions:   LOS: 1 day     Sidney Ace, MD [facility]   If 7PM-7AM, please contact night-coverage  11/19/2022, 1:59 PM

## 2022-11-19 NOTE — Plan of Care (Signed)

## 2022-11-20 DIAGNOSIS — J123 Human metapneumovirus pneumonia: Secondary | ICD-10-CM | POA: Diagnosis not present

## 2022-11-20 LAB — HEMOGLOBIN A1C
Hgb A1c MFr Bld: 5.8 % — ABNORMAL HIGH (ref 4.8–5.6)
Mean Plasma Glucose: 120 mg/dL

## 2022-11-20 MED ORDER — IPRATROPIUM-ALBUTEROL 0.5-2.5 (3) MG/3ML IN SOLN: 3.0000 mL | Freq: Two times a day (BID) | RESPIRATORY_TRACT | Status: DC

## 2022-11-20 MED ORDER — BENZONATATE 100 MG PO CAPS
200.0000 mg | ORAL_CAPSULE | Freq: Three times a day (TID) | ORAL | Status: DC
  Administered 2022-11-20: 200 mg via ORAL
  Filled 2022-11-20: qty 2

## 2022-11-20 MED ORDER — ALBUTEROL SULFATE HFA 108 (90 BASE) MCG/ACT IN AERS: 2.0000 | INHALATION_SPRAY | Freq: Four times a day (QID) | RESPIRATORY_TRACT | 0 refills | Status: DC | PRN

## 2022-11-20 MED ORDER — BENZONATATE 200 MG PO CAPS: 200.0000 mg | ORAL_CAPSULE | Freq: Three times a day (TID) | ORAL | 0 refills | Status: DC | PRN

## 2022-11-20 MED ORDER — PREDNISONE 20 MG PO TABS: 40.0000 mg | ORAL_TABLET | Freq: Every day | ORAL | 0 refills | Status: AC

## 2022-11-20 NOTE — Progress Notes (Signed)
Pt's 02 sats were 91% on RA at 2015.  Pt then placed on home CPAP.  02 sats checked at 2300; 02 sats decreased to 88% on CPAP.  Pt placed on 3L of 02 to sustain 02 sats of 94%.

## 2022-11-20 NOTE — Discharge Summary (Signed)
Physician Discharge Summary  Emily Jefferson [id number] DOB: Feb 18, 1961 DOA: 11/18/2022  PCP: Maryland Pink, MD  Admit date: 11/18/2022 Discharge date: 11/20/2022  Admitted From: Home Disposition:  Home  Recommendations for Outpatient Follow-up:  Follow up with PCP in 1-2 weeks   Home Health:No Equipment/Devices:None   Discharge Condition:Stable  CODE STATUS:FULL  Diet recommendation: Reg  Brief/Interim Summary: 62 y.o. female with medical history significant of hypertension, hypothyroidism, morbid obesity, OSA on CPAP, who presents with cough, shortness of breath.   Patient states that she has cough and shortness breath for more than 6 days, which has been progressively worsening.  Patient coughs up little mucus, mostly dry cough.  She has nasal congestion, wheezing, denies chest pain, fever or chills.  Patient does not have nausea, vomiting, diarrhea or abdominal pain.    Started on intravenous steroids for presumed bronchospasm and respiratory coverage antibiotics for presumed CAP.  Respiratory viral panel positive for human metapneumovirus.  Procalcitonin negative.   On day of discharge patient stable on room air.  Wheezing has resolved.  Shortness of breath improving.  Patient still having some cough, nonproductive.  Stable for discharge at this time.  No antibiotics indicated on discharge.  Will discharge on 5-day course of prednisone, albuterol MDI as needed, Tessalon Perles 3 times daily as needed.  Follow-up outpatient PCP.    Discharge Diagnoses:  Principal Problem:   CAP (community acquired pneumonia) Active Problems:   Myocardial injury   Hypertension   Hypothyroidism   Sleep apnea   Hypokalemia   Morbid obesity with BMI of 50.0-59.9, adult (HCC)   Pneumonia due to human metapneumovirus Hypoxemia CTA negative for PE.  Bilateral patchy groundglass infiltration.  Procalcitonin negative.  No fever, no leukocytosis.  Suspect viral pneumonia.  Also has some  wheezing however improving. Plan: No antibiotics indicated.  Patient stable for discharge.  Will discharge on short course of prednisone, albuterol MDI as needed, Tessalon Perles.  Follow-up outpatient PCP.  Elevated troponin No significant delta.  No chest pain.  DC aspirin.  Check hemoglobin A1c.  Pending.   Essential hypertension Continue home Hyzaar.     Hypothyroidism PTA thyroid regimen   Sleep apnea Nightly CPAP     Morbid obesity BMI 52.  This complicates overall care and prognosis  Discharge Instructions  Discharge Instructions     Diet - low sodium heart healthy   Complete by: As directed    Increase activity slowly   Complete by: As directed       Allergies as of 11/20/2022       Reactions   Penicillins Rash        Medication List     STOP taking these medications    Pfizer COVID-19 Vac Bivalent injection Generic drug: COVID-19 mRNA bivalent vaccine Therapist, music)       TAKE these medications    albuterol 108 (90 Base) MCG/ACT inhaler Commonly known as: VENTOLIN HFA Inhale 2 puffs into the lungs every 6 (six) hours as needed for wheezing or shortness of breath.   Armour Thyroid 180 MG tablet Generic drug: thyroid Take 90-180 mg by mouth See admin instructions. Take 1 tablet (180mg ) every other night; alternate nights with  tablet (90mg )   benzonatate 200 MG capsule Commonly known as: TESSALON Take 1 capsule (200 mg total) by mouth 3 (three) times daily as needed for cough.   levocetirizine 5 MG tablet Commonly known as: XYZAL Take 5 mg by mouth every evening.   liothyronine 5 MCG tablet Commonly known  as: CYTOMEL Take 10 mcg by mouth daily.   losartan-hydrochlorothiazide 100-25 MG tablet Commonly known as: HYZAAR Take 1 tablet by mouth daily.   montelukast 10 MG tablet Commonly known as: SINGULAIR Take 10 mg by mouth at bedtime.   Multivitamin Gummies Adult Chew Chew 1 tablet by mouth daily.   predniSONE 20 MG tablet Commonly  known as: DELTASONE Take 2 tablets (40 mg total) by mouth daily for 5 days. Start taking on: November 21, 2022        Allergies  Allergen Reactions   Penicillins Rash    Consultations: None   Procedures/Studies: CT Angio Chest PE W and/or Wo Contrast  Result Date: 11/18/2022 CLINICAL DATA:  Cough and chest congestion. EXAM: CT ANGIOGRAPHY CHEST WITH CONTRAST TECHNIQUE: Multidetector CT imaging of the chest was performed using the standard protocol during bolus administration of intravenous contrast. Multiplanar CT image reconstructions and MIPs were obtained to evaluate the vascular anatomy. RADIATION DOSE REDUCTION: This exam was performed according to the departmental dose-optimization program which includes automated exposure control, adjustment of the mA and/or kV according to patient size and/or use of iterative reconstruction technique. CONTRAST:  79mL OMNIPAQUE IOHEXOL 350 MG/ML SOLN COMPARISON:  Chest radiograph dated 11/18/2022. FINDINGS: Cardiovascular: Satisfactory opacification of the pulmonary arteries to the segmental level. No evidence of pulmonary embolism. Vascular calcifications are seen in the aortic arch. Normal heart size. No pericardial effusion. Mediastinum/Nodes: There is mild bilateral hilar and mediastinal lymphadenopathy. No axillary lymphadenopathy. The thyroid gland, trachea, and esophagus demonstrate no significant abnormality. Lungs/Pleura: There is moderate bilateral patchy ground-glass opacities and tree-in-bud opacities. There is mild left upper lobe atelectasis. No pleural effusion or pneumothorax. Upper Abdomen: There is colonic diverticulosis without evidence of diverticulitis. Gallbladder is surgically absent. Musculoskeletal: Degenerative changes are seen in the spine. Review of the MIP images confirms the above findings. IMPRESSION: 1. No evidence for pulmonary embolism. 2. Moderate bilateral patchy ground-glass opacities and tree-in-bud opacities, likely  representing pneumonia. Mild bilateral hilar and mediastinal lymphadenopathy is likely reactive. Aortic Atherosclerosis (ICD10-I70.0). Electronically Signed   By: Zerita Boers M.D.   On: 11/18/2022 14:59   DG Chest 2 View  Result Date: 11/18/2022 CLINICAL DATA:  SOB EXAM: CHEST - 2 VIEW COMPARISON:  None available. FINDINGS: The heart size and mediastinal contours are within normal limits. Both lungs are clear. No pneumothorax or pleural effusion. Aorta calcified. There are thoracic degenerative changes. IMPRESSION: No active cardiopulmonary disease. Electronically Signed   By: Sammie Bench M.D.   On: 11/18/2022 13:19      Subjective: Seen and examined on the day of discharge.  Stable no distress.  Appropriate for discharge home.  Discharge Exam: Vitals:   11/20/22 0751 11/20/22 0813  BP:  (!) 145/74  Pulse:  88  Resp:  17  Temp:  97.7 F (36.5 C)  SpO2: 94% 92%   Vitals:   11/19/22 2015 11/20/22 0037 11/20/22 0751 11/20/22 0813  BP:  (!) 113/53  (!) 145/74  Pulse:  73  88  Resp:  15  17  Temp:  98.1 F (36.7 C)  97.7 F (36.5 C)  TempSrc:      SpO2: 91% 95% 94% 92%  Weight:      Height:        General: Pt is alert, awake, not in acute distress Cardiovascular: RRR, S1/S2 +, no rubs, no gallops Respiratory: CTA bilaterally, no wheezing, no rhonchi Abdominal: Soft, NT, ND, bowel sounds + Extremities: no edema, no cyanosis  The results of significant diagnostics from this hospitalization (including imaging, microbiology, ancillary and laboratory) are listed below for reference.     Microbiology: Recent Results (from the past 240 hour(s))  Resp panel by RT-PCR (RSV, Flu A&B, Covid) Anterior Nasal Swab     Status: None   Collection Time: 11/18/22 12:48 PM   Specimen: Anterior Nasal Swab  Result Value Ref Range Status   SARS Coronavirus 2 by RT PCR NEGATIVE NEGATIVE Final    Comment: (NOTE) SARS-CoV-2 target nucleic acids are NOT DETECTED.  The SARS-CoV-2 RNA  is generally detectable in upper respiratory specimens during the acute phase of infection. The lowest concentration of SARS-CoV-2 viral copies this assay can detect is 138 copies/mL. A negative result does not preclude SARS-Cov-2 infection and should not be used as the sole basis for treatment or other patient management decisions. A negative result may occur with  improper specimen collection/handling, submission of specimen other than nasopharyngeal swab, presence of viral mutation(s) within the areas targeted by this assay, and inadequate number of viral copies(<138 copies/mL). A negative result must be combined with clinical observations, patient history, and epidemiological information. The expected result is Negative.  Fact Sheet for Patients:  EntrepreneurPulse.com.au  Fact Sheet for Healthcare Providers:  IncredibleEmployment.be  This test is no t yet approved or cleared by the [place] FDA and  has been authorized for detection and/or diagnosis of SARS-CoV-2 by FDA under an Emergency Use Authorization (EUA). This EUA will remain  in effect (meaning this test can be used) for the duration of the COVID-19 declaration under Section 564(b)(1) of the Act, 21 U.S.C.section 360bbb-3(b)(1), unless the authorization is terminated  or revoked sooner.       Influenza A by PCR NEGATIVE NEGATIVE Final   Influenza B by PCR NEGATIVE NEGATIVE Final    Comment: (NOTE) The Xpert Xpress SARS-CoV-2/FLU/RSV plus assay is intended as an aid in the diagnosis of influenza from Nasopharyngeal swab specimens and should not be used as a sole basis for treatment. Nasal washings and aspirates are unacceptable for Xpert Xpress SARS-CoV-2/FLU/RSV testing.  Fact Sheet for Patients: EntrepreneurPulse.com.au  Fact Sheet for Healthcare Providers: IncredibleEmployment.be  This test is not yet approved or cleared by the  [place] FDA and has been authorized for detection and/or diagnosis of SARS-CoV-2 by FDA under an Emergency Use Authorization (EUA). This EUA will remain in effect (meaning this test can be used) for the duration of the COVID-19 declaration under Section 564(b)(1) of the Act, 21 U.S.C. section 360bbb-3(b)(1), unless the authorization is terminated or revoked.     Resp Syncytial Virus by PCR NEGATIVE NEGATIVE Final    Comment: (NOTE) Fact Sheet for Patients: EntrepreneurPulse.com.au  Fact Sheet for Healthcare Providers: IncredibleEmployment.be  This test is not yet approved or cleared by the [place] FDA and has been authorized for detection and/or diagnosis of SARS-CoV-2 by FDA under an Emergency Use Authorization (EUA). This EUA will remain in effect (meaning this test can be used) for the duration of the COVID-19 declaration under Section 564(b)(1) of the Act, 21 U.S.C. section 360bbb-3(b)(1), unless the authorization is terminated or revoked.  Performed at [facility], [address] [place], [place] [address]   Culture, blood (routine x 2) Call MD if unable to obtain prior to antibiotics being given     Status: None (Preliminary result)   Collection Time: 11/18/22  4:59 PM   Specimen: BLOOD  Result Value Ref Range Status   Specimen Description BLOOD BLOOD RIGHT  HAND  Final   Special Requests   Final    BOTTLES DRAWN AEROBIC AND ANAEROBIC Blood Culture adequate volume   Culture   Final    NO GROWTH 2 DAYS Performed at [facility], [address] [place], [place] [address]    Report Status PENDING  Incomplete  Culture, blood (routine x 2) Call MD if unable to obtain prior to antibiotics being given     Status: None (Preliminary result)   Collection Time: 11/18/22  5:07 PM   Specimen: BLOOD  Result Value Ref Range Status   Specimen Description BLOOD BLOOD LEFT HAND  Final   Special Requests   Final     BOTTLES DRAWN AEROBIC AND ANAEROBIC Blood Culture adequate volume   Culture   Final    NO GROWTH 2 DAYS Performed at [facility], [address], [place], [place] [address]    Report Status PENDING  Incomplete  Respiratory (~20 pathogens) panel by PCR     Status: Abnormal   Collection Time: 11/18/22 11:00 PM  Result Value Ref Range Status   Adenovirus NOT DETECTED NOT DETECTED Final   Coronavirus 229E NOT DETECTED NOT DETECTED Final    Comment: (NOTE) The Coronavirus on the Respiratory Panel, DOES NOT test for the novel  Coronavirus (2019 nCoV)    Coronavirus HKU1 NOT DETECTED NOT DETECTED Final   Coronavirus NL63 NOT DETECTED NOT DETECTED Final   Coronavirus OC43 NOT DETECTED NOT DETECTED Final   Metapneumovirus DETECTED (A) NOT DETECTED Final   Rhinovirus / Enterovirus NOT DETECTED NOT DETECTED Final   Influenza A NOT DETECTED NOT DETECTED Final   Influenza B NOT DETECTED NOT DETECTED Final   Parainfluenza Virus 1 NOT DETECTED NOT DETECTED Final   Parainfluenza Virus 2 NOT DETECTED NOT DETECTED Final   Parainfluenza Virus 3 NOT DETECTED NOT DETECTED Final   Parainfluenza Virus 4 NOT DETECTED NOT DETECTED Final   Respiratory Syncytial Virus NOT DETECTED NOT DETECTED Final   Bordetella pertussis NOT DETECTED NOT DETECTED Final   Bordetella Parapertussis NOT DETECTED NOT DETECTED Final   Chlamydophila pneumoniae NOT DETECTED NOT DETECTED Final   Mycoplasma pneumoniae NOT DETECTED NOT DETECTED Final    Comment: Performed at [facility], [place] [address] [place], [place] [address]     Labs: BNP (last 3 results) Recent Labs    11/18/22 1330  BNP [id number]   Basic Metabolic Panel: Recent Labs  Lab 11/18/22 1249 11/18/22 1426 11/19/22 0334  NA 138  --  139  K 3.3*  --  4.0  CL 105  --  109  CO2 22  --  25  GLUCOSE 121*  --  183*  BUN 16  --  17  CREATININE 0.75  --  0.67  CALCIUM 8.4*  --  8.4*  MG  --  2.1  --    Liver Function Tests: Recent Labs  Lab  11/18/22 1249  AST 49*  ALT 28  ALKPHOS 83  BILITOT 0.4  PROT 8.4*  ALBUMIN 4.2   No results for input(s): "LIPASE", "AMYLASE" in the last 168 hours. No results for input(s): "AMMONIA" in the last 168 hours. CBC: Recent Labs  Lab 11/18/22 1249 11/19/22 0334  WBC 6.4 2.7*  NEUTROABS 3.6  --   HGB 15.9* 14.6  HCT 46.9* 43.6  MCV 89.3 90.8  PLT 198 164   Cardiac Enzymes: No results for input(s): "CKTOTAL", "CKMB", "CKMBINDEX", "TROPONINI" in the last 168 hours. BNP: Invalid input(s): "POCBNP"  CBG: No results for input(s): "GLUCAP" in the last 168 hours. D-Dimer No results for input(s): "DDIMER" in the last 72 hours. Hgb A1c Recent Labs    11/18/22 1659  HGBA1C 5.8*   Lipid Profile Recent Labs    11/19/22 0334  CHOL 108  HDL 37*  LDLCALC 58  TRIG 66  CHOLHDL 2.9   Thyroid function studies No results for input(s): "TSH", "T4TOTAL", "T3FREE", "THYROIDAB" in the last 72 hours.  Invalid input(s): "FREET3" Anemia work up No results for input(s): "VITAMINB12", "FOLATE", "FERRITIN", "TIBC", "IRON", "RETICCTPCT" in the last 72 hours. Urinalysis No results found for: "COLORURINE", "APPEARANCEUR", "LABSPEC", "PHURINE", "GLUCOSEU", "HGBUR", "BILIRUBINUR", "KETONESUR", "PROTEINUR", "UROBILINOGEN", "NITRITE", "LEUKOCYTESUR" Sepsis Labs Recent Labs  Lab 11/18/22 1249 11/19/22 0334  WBC 6.4 2.7*   Microbiology Recent Results (from the past 240 hour(s))  Resp panel by RT-PCR (RSV, Flu A&B, Covid) Anterior Nasal Swab     Status: None   Collection Time: 11/18/22 12:48 PM   Specimen: Anterior Nasal Swab  Result Value Ref Range Status   SARS Coronavirus 2 by RT PCR NEGATIVE NEGATIVE Final    Comment: (NOTE) SARS-CoV-2 target nucleic acids are NOT DETECTED.  The SARS-CoV-2 RNA is generally detectable in upper respiratory specimens during the acute phase of infection. The lowest concentration of SARS-CoV-2 viral copies this assay can detect is 138 copies/mL. A  negative result does not preclude SARS-Cov-2 infection and should not be used as the sole basis for treatment or other patient management decisions. A negative result may occur with  improper specimen collection/handling, submission of specimen other than nasopharyngeal swab, presence of viral mutation(s) within the areas targeted by this assay, and inadequate number of viral copies(<138 copies/mL). A negative result must be combined with clinical observations, patient history, and epidemiological information. The expected result is Negative.  Fact Sheet for Patients:  EntrepreneurPulse.com.au  Fact Sheet for Healthcare Providers:  IncredibleEmployment.be  This test is no t yet approved or cleared by the [place] FDA and  has been authorized for detection and/or diagnosis of SARS-CoV-2 by FDA under an Emergency Use Authorization (EUA). This EUA will remain  in effect (meaning this test can be used) for the duration of the COVID-19 declaration under Section 564(b)(1) of the Act, 21 U.S.C.section 360bbb-3(b)(1), unless the authorization is terminated  or revoked sooner.       Influenza A by PCR NEGATIVE NEGATIVE Final   Influenza B by PCR NEGATIVE NEGATIVE Final    Comment: (NOTE) The Xpert Xpress SARS-CoV-2/FLU/RSV plus assay is intended as an aid in the diagnosis of influenza from Nasopharyngeal swab specimens and should not be used as a sole basis for treatment. Nasal washings and aspirates are unacceptable for Xpert Xpress SARS-CoV-2/FLU/RSV testing.  Fact Sheet for Patients: EntrepreneurPulse.com.au  Fact Sheet for Healthcare Providers: IncredibleEmployment.be  This test is not yet approved or cleared by the [place] FDA and has been authorized for detection and/or diagnosis of SARS-CoV-2 by FDA under an Emergency Use Authorization (EUA). This EUA will remain in effect (meaning this test can  be used) for the duration of the COVID-19 declaration under Section 564(b)(1) of the Act, 21 U.S.C. section 360bbb-3(b)(1), unless the authorization is terminated or revoked.     Resp Syncytial Virus by PCR NEGATIVE NEGATIVE Final    Comment: (NOTE) Fact Sheet for Patients: EntrepreneurPulse.com.au  Fact Sheet for Healthcare Providers: IncredibleEmployment.be  This test is not yet approved or cleared by the [place] FDA and has been authorized for detection and/or diagnosis  of SARS-CoV-2 by FDA under an Emergency Use Authorization (EUA). This EUA will remain in effect (meaning this test can be used) for the duration of the COVID-19 declaration under Section 564(b)(1) of the Act, 21 U.S.C. section 360bbb-3(b)(1), unless the authorization is terminated or revoked.  Performed at [facility], [address] [place], [place] [address]   Culture, blood (routine x 2) Call MD if unable to obtain prior to antibiotics being given     Status: None (Preliminary result)   Collection Time: 11/18/22  4:59 PM   Specimen: BLOOD  Result Value Ref Range Status   Specimen Description BLOOD BLOOD RIGHT HAND  Final   Special Requests   Final    BOTTLES DRAWN AEROBIC AND ANAEROBIC Blood Culture adequate volume   Culture   Final    NO GROWTH 2 DAYS Performed at [facility], [address], [place], [place] [address]    Report Status PENDING  Incomplete  Culture, blood (routine x 2) Call MD if unable to obtain prior to antibiotics being given     Status: None (Preliminary result)   Collection Time: 11/18/22  5:07 PM   Specimen: BLOOD  Result Value Ref Range Status   Specimen Description BLOOD BLOOD LEFT HAND  Final   Special Requests   Final    BOTTLES DRAWN AEROBIC AND ANAEROBIC Blood Culture adequate volume   Culture   Final    NO GROWTH 2 DAYS Performed at [facility], [address] [place], [place] [address]     Report Status PENDING  Incomplete  Respiratory (~20 pathogens) panel by PCR     Status: Abnormal   Collection Time: 11/18/22 11:00 PM  Result Value Ref Range Status   Adenovirus NOT DETECTED NOT DETECTED Final   Coronavirus 229E NOT DETECTED NOT DETECTED Final    Comment: (NOTE) The Coronavirus on the Respiratory Panel, DOES NOT test for the novel  Coronavirus (2019 nCoV)    Coronavirus HKU1 NOT DETECTED NOT DETECTED Final   Coronavirus NL63 NOT DETECTED NOT DETECTED Final   Coronavirus OC43 NOT DETECTED NOT DETECTED Final   Metapneumovirus DETECTED (A) NOT DETECTED Final   Rhinovirus / Enterovirus NOT DETECTED NOT DETECTED Final   Influenza A NOT DETECTED NOT DETECTED Final   Influenza B NOT DETECTED NOT DETECTED Final   Parainfluenza Virus 1 NOT DETECTED NOT DETECTED Final   Parainfluenza Virus 2 NOT DETECTED NOT DETECTED Final   Parainfluenza Virus 3 NOT DETECTED NOT DETECTED Final   Parainfluenza Virus 4 NOT DETECTED NOT DETECTED Final   Respiratory Syncytial Virus NOT DETECTED NOT DETECTED Final   Bordetella pertussis NOT DETECTED NOT DETECTED Final   Bordetella Parapertussis NOT DETECTED NOT DETECTED Final   Chlamydophila pneumoniae NOT DETECTED NOT DETECTED Final   Mycoplasma pneumoniae NOT DETECTED NOT DETECTED Final    Comment: Performed at [facility], [place] [address] [place], [place] [address]     Time coordinating discharge: Over 30 minutes  SIGNED:   Sidney Ace, MD  [facility] 11/20/2022, 12:37 PM Pager   If 7PM-7AM, please contact night-coverage

## 2022-11-21 LAB — LEGIONELLA PNEUMOPHILA SEROGP 1 UR AG: L. pneumophila Serogp 1 Ur Ag: NEGATIVE

## 2022-11-23 LAB — CULTURE, BLOOD (ROUTINE X 2)
Culture: NO GROWTH
Culture: NO GROWTH
Special Requests: ADEQUATE
Special Requests: ADEQUATE

## 2022-12-06 DIAGNOSIS — M549 Dorsalgia, unspecified: Secondary | ICD-10-CM | POA: Diagnosis not present

## 2022-12-06 DIAGNOSIS — R0902 Hypoxemia: Secondary | ICD-10-CM | POA: Diagnosis not present

## 2022-12-06 DIAGNOSIS — R058 Other specified cough: Secondary | ICD-10-CM | POA: Diagnosis not present

## 2022-12-06 DIAGNOSIS — Z8701 Personal history of pneumonia (recurrent): Secondary | ICD-10-CM | POA: Diagnosis not present

## 2023-01-24 DIAGNOSIS — G4733 Obstructive sleep apnea (adult) (pediatric): Secondary | ICD-10-CM | POA: Diagnosis not present

## 2023-02-19 DIAGNOSIS — R058 Other specified cough: Secondary | ICD-10-CM | POA: Diagnosis not present

## 2023-02-19 DIAGNOSIS — Z8701 Personal history of pneumonia (recurrent): Secondary | ICD-10-CM | POA: Diagnosis not present

## 2023-02-19 DIAGNOSIS — J8489 Other specified interstitial pulmonary diseases: Secondary | ICD-10-CM | POA: Diagnosis not present

## 2023-03-05 ENCOUNTER — Other Ambulatory Visit: Payer: Self-pay | Admitting: Family Medicine

## 2023-03-05 DIAGNOSIS — Z1231 Encounter for screening mammogram for malignant neoplasm of breast: Secondary | ICD-10-CM

## 2023-04-13 DIAGNOSIS — Z0000 Encounter for general adult medical examination without abnormal findings: Secondary | ICD-10-CM | POA: Diagnosis not present

## 2023-04-13 DIAGNOSIS — Z1389 Encounter for screening for other disorder: Secondary | ICD-10-CM | POA: Diagnosis not present

## 2023-04-13 DIAGNOSIS — Z13220 Encounter for screening for lipoid disorders: Secondary | ICD-10-CM | POA: Diagnosis not present

## 2023-04-16 DIAGNOSIS — R7303 Prediabetes: Secondary | ICD-10-CM | POA: Diagnosis not present

## 2023-04-16 DIAGNOSIS — E039 Hypothyroidism, unspecified: Secondary | ICD-10-CM | POA: Diagnosis not present

## 2023-04-16 DIAGNOSIS — Z6841 Body Mass Index (BMI) 40.0 and over, adult: Secondary | ICD-10-CM | POA: Diagnosis not present

## 2023-04-17 DIAGNOSIS — I10 Essential (primary) hypertension: Secondary | ICD-10-CM | POA: Diagnosis not present

## 2023-04-17 DIAGNOSIS — G4733 Obstructive sleep apnea (adult) (pediatric): Secondary | ICD-10-CM | POA: Diagnosis not present

## 2023-04-17 DIAGNOSIS — Z0000 Encounter for general adult medical examination without abnormal findings: Secondary | ICD-10-CM | POA: Diagnosis not present

## 2023-04-17 DIAGNOSIS — E039 Hypothyroidism, unspecified: Secondary | ICD-10-CM | POA: Diagnosis not present

## 2023-04-23 ENCOUNTER — Ambulatory Visit
Admission: RE | Admit: 2023-04-23 | Discharge: 2023-04-23 | Disposition: A | Discharge status: Home / Self Care | Payer: Federal, State, Local not specified - PPO | Source: Ambulatory Visit | Attending: Family Medicine | Admitting: Family Medicine

## 2023-04-23 DIAGNOSIS — Z1231 Encounter for screening mammogram for malignant neoplasm of breast: Secondary | ICD-10-CM | POA: Diagnosis not present

## 2023-04-26 DIAGNOSIS — G4733 Obstructive sleep apnea (adult) (pediatric): Secondary | ICD-10-CM | POA: Diagnosis not present

## 2023-05-28 DIAGNOSIS — G4733 Obstructive sleep apnea (adult) (pediatric): Secondary | ICD-10-CM | POA: Diagnosis not present

## 2023-05-28 DIAGNOSIS — Z03818 Encounter for observation for suspected exposure to other biological agents ruled out: Secondary | ICD-10-CM | POA: Diagnosis not present

## 2023-05-28 DIAGNOSIS — I10 Essential (primary) hypertension: Secondary | ICD-10-CM | POA: Diagnosis not present

## 2023-05-28 DIAGNOSIS — J069 Acute upper respiratory infection, unspecified: Secondary | ICD-10-CM | POA: Diagnosis not present

## 2023-06-19 DIAGNOSIS — E039 Hypothyroidism, unspecified: Secondary | ICD-10-CM | POA: Diagnosis not present

## 2023-07-02 IMAGING — MG MM DIGITAL SCREENING BILAT W/ TOMO AND CAD
6 of 10 series · 6 of 30 positions shown · non-contrast
Comparison: Previous exam(s).

ACR Breast Density Category a: The breast tissue is almost entirely
fatty.

CLINICAL DATA: Screening.

EXAM:
DIGITAL SCREENING BILATERAL MAMMOGRAM WITH TOMOSYNTHESIS AND CAD
TECHNIQUE: Bilateral screening digital craniocaudal and mediolateral oblique
mammograms were obtained. Bilateral screening digital breast
tomosynthesis was performed. The images were evaluated with
computer-aided detection.

[L MLO synth-2D]
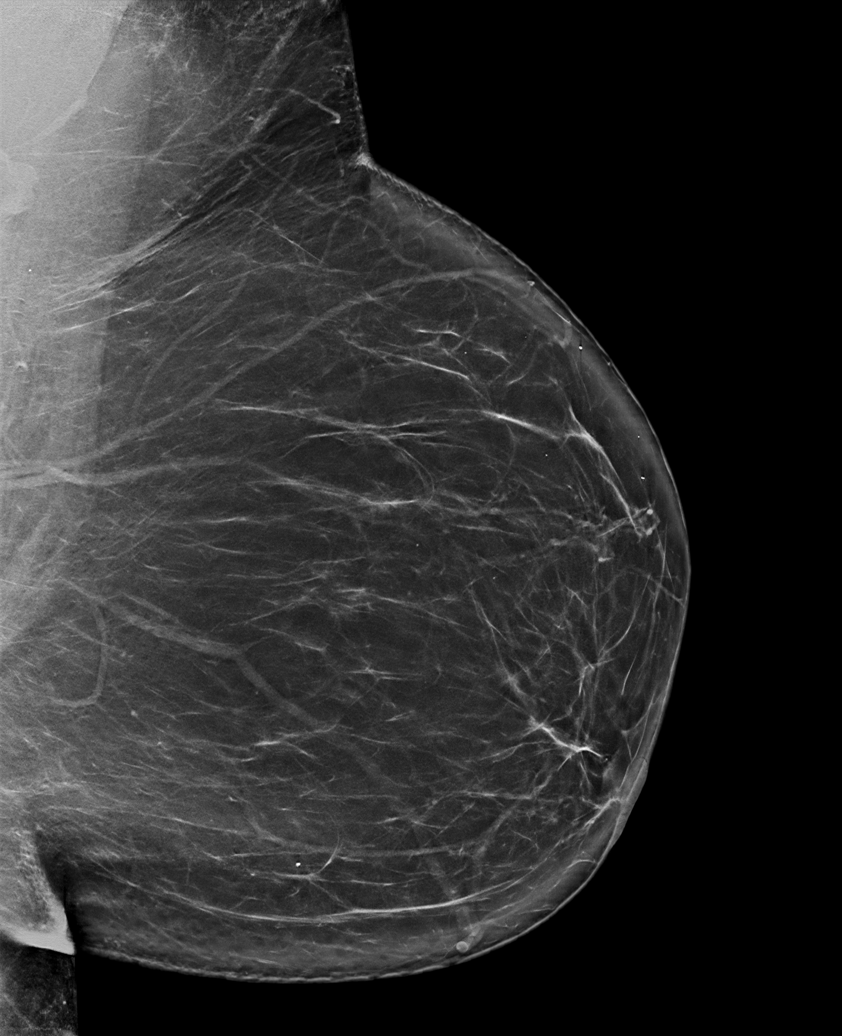

[R MLO synth-2D]
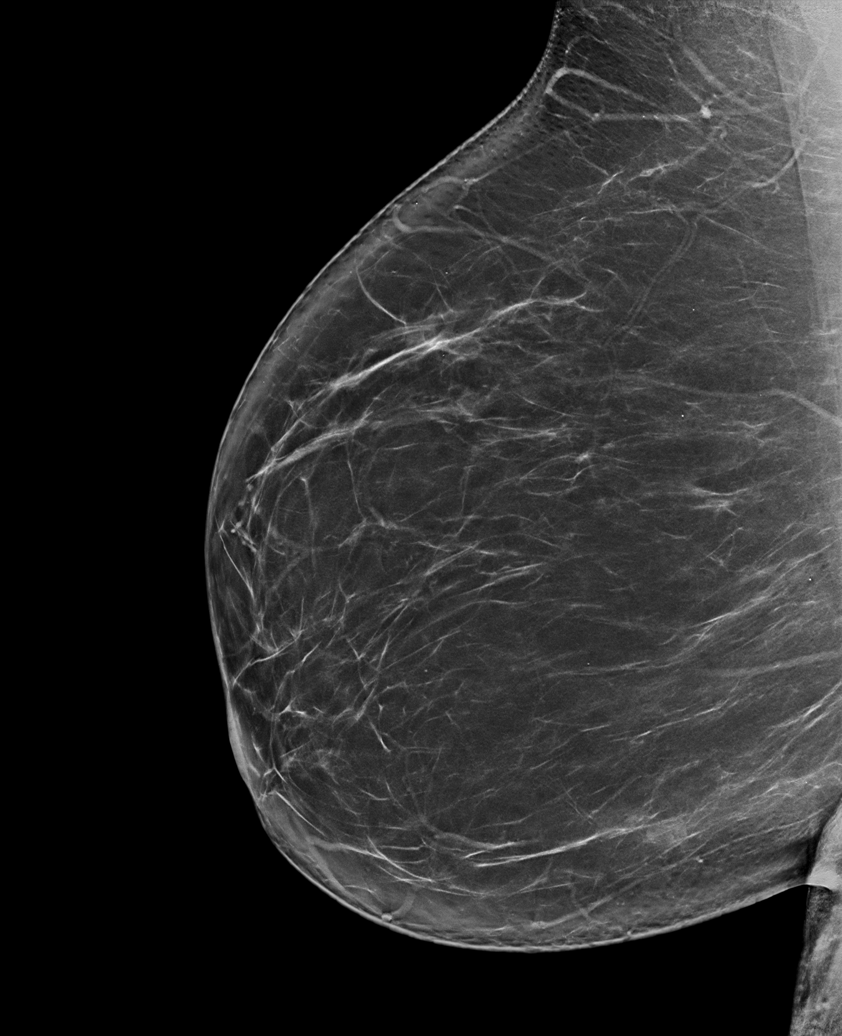

[L CC synth-2D]
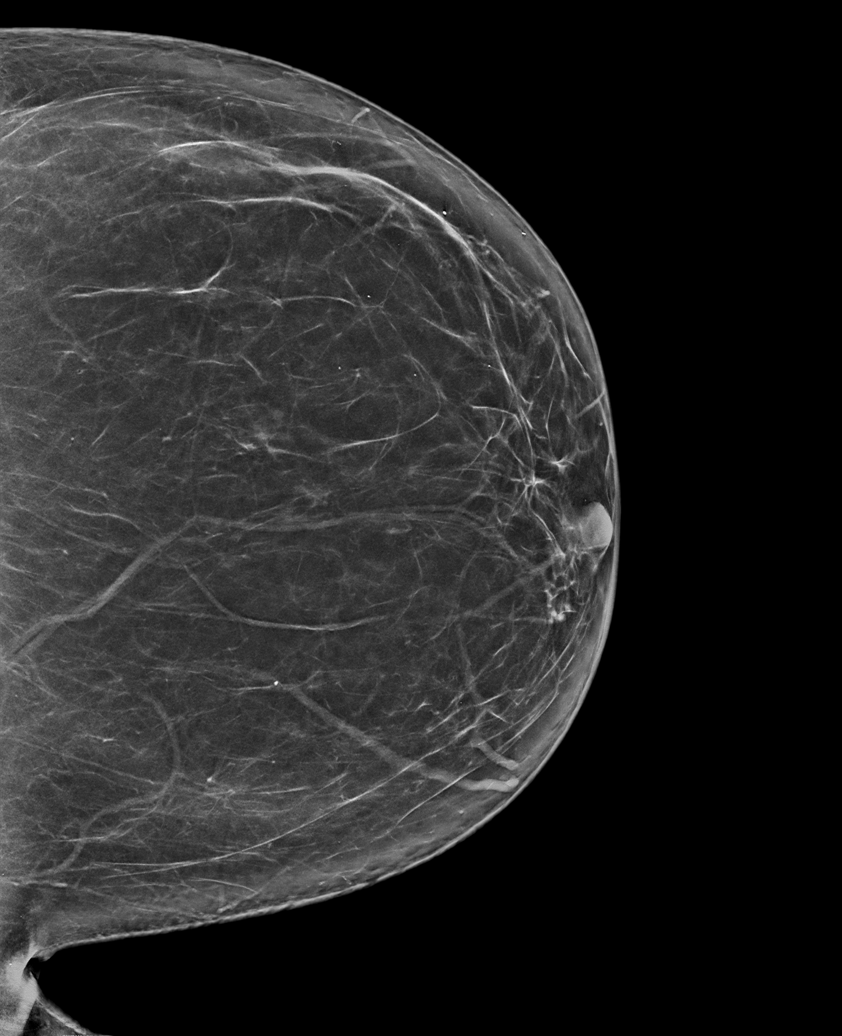

[R CC synth-2D]
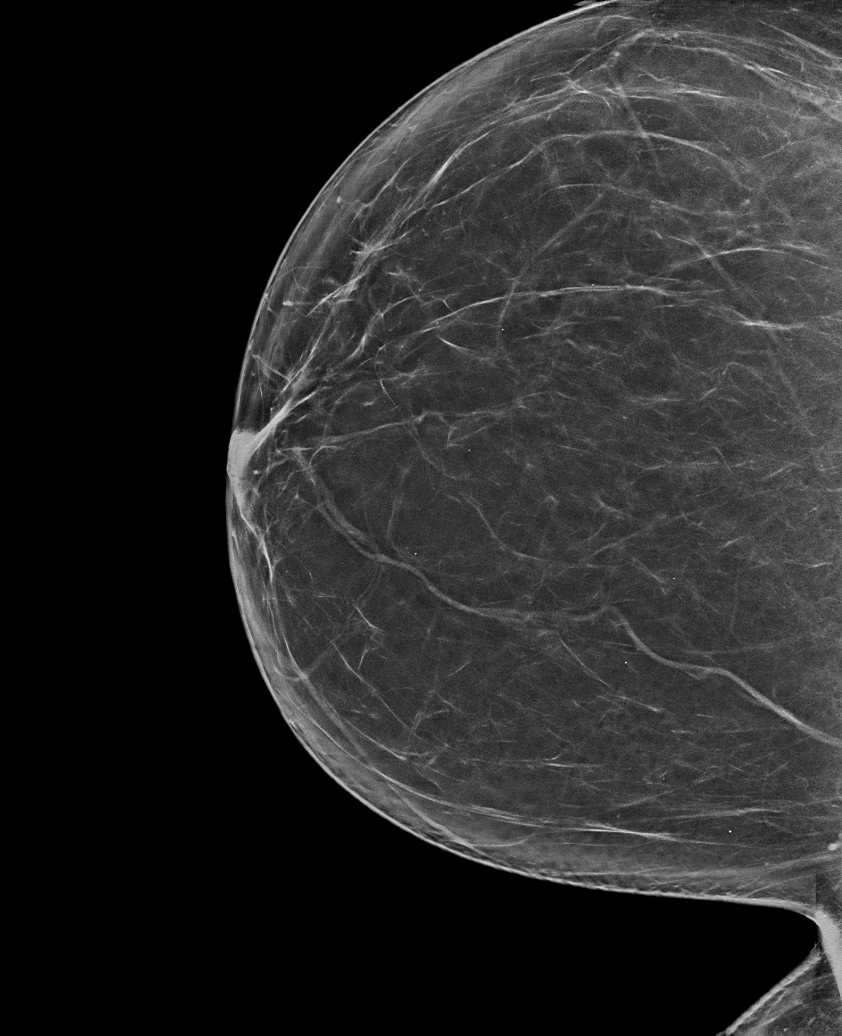

[R XCCL synth-2D]
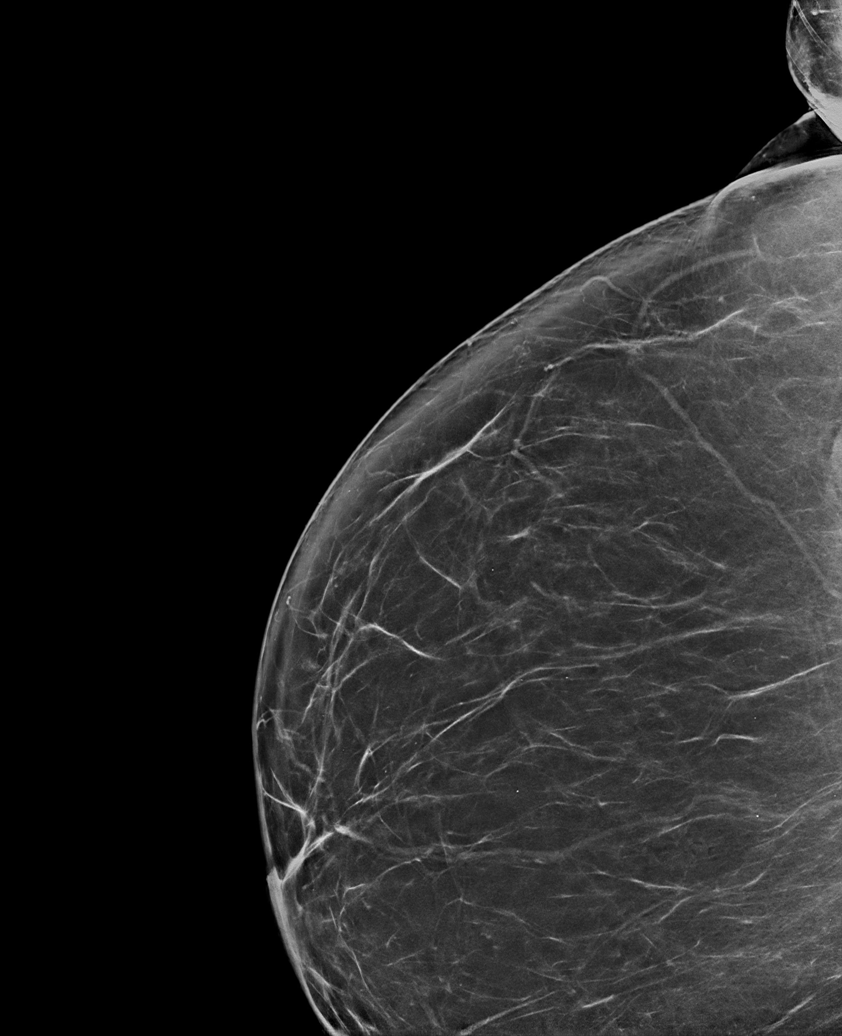

[L MLO tomo · tomo slice 49/97.0]
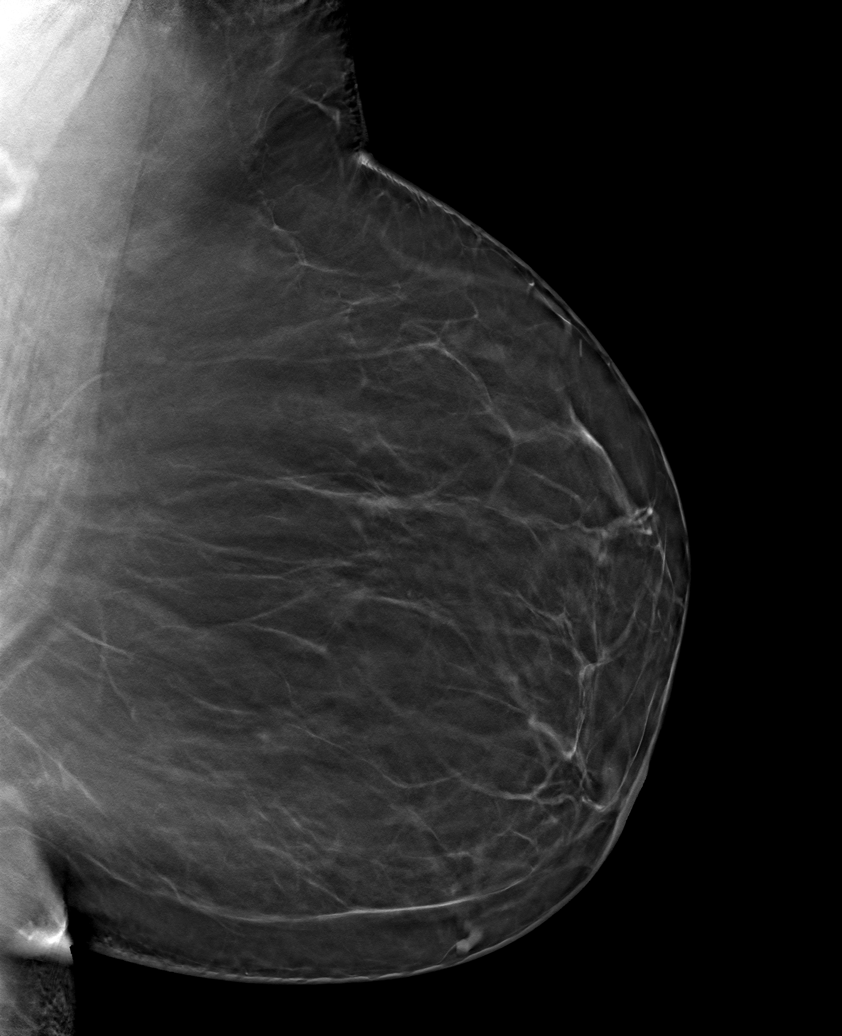

[6 of 30 positions shown; findings below may reference images not displayed]

FINDINGS: There are no findings suspicious for malignancy.
IMPRESSION: No mammographic evidence of malignancy. A result letter of this
screening mammogram will be mailed directly to the patient.

RECOMMENDATION:
Screening mammogram in one year. (Code:0E-[id number])

BI-RADS CATEGORY  1: Negative.

## 2023-07-02 NOTE — Progress Notes (Unsigned)
Cardiology Office Note  Date:  07/03/2023   ID:  Emily Jefferson, DOB [birth year], MRN [id number]  PCP:  Jerl Mina, MD   Chief Complaint  Patient presents with   New Patient (Initial Visit)    Ref by Dr. Burnett Sheng for chest pain. Former patient of Dr. Lady Gary patient. Patient c/o shortness of breath & chest pain at times. Medications reviewed by the patient verbally.     HPI:  Emily Jefferson is a 62 year old woman with past medical history of Hypertension Hypothyroidism Obesity Sleep apnea on CPAP Who presents by referral from Dr. Burnett Sheng for consultation of her chest pain at rest, hypertension morbid obesity, irregular heart beat/DVT  Previously followed by cardiology at [facility] last seen July 05, 2022 She reports feeling well Viral pneumonia hospitalization March 2024, has recovered Occasional shortness of breath on deep inspiration  Reports having asymptomatic PVCs  Prior studies reviewed Several echoes over the past several years, normal ejection fraction  Echo July 2021  revealed mild to moderate MR with preserved LV function. Pulmonary pressures were mildly elevated at 46.5.   Stress echo April 2022 Normal study, no focal wall motion abnormality  Lab work reviewed A1C 5.7 Total chol 181, LDL 93  EKG personally reviewed by myself on todays visit EKG Interpretation Date/Time:  Tuesday July 03 2023 15:49:57 EST Ventricular Rate:  61 PR Interval:  122 QRS Duration:  94 QT Interval:  366 QTC Calculation: 368 R Axis:   48  Text Interpretation: Normal sinus rhythm When compared with ECG of 18-Nov-2022 12:53, Minimal criteria for Inferior infarct are no longer Present Confirmed by Julien Nordmann [phone]) on 07/03/2023 3:55:23 PM    PMH:   has a past medical history of Allergy, Arthritis, Chicken pox, Dysrhythmia, Gallbladder disease, Hypertension, Sleep apnea, and Thyroid disease.  PSH:    Past Surgical History:  Procedure Laterality Date   ABDOMINAL  HYSTERECTOMY     CARPAL TUNNEL RELEASE     CHOLECYSTECTOMY     COLONOSCOPY WITH PROPOFOL N/A 07/08/2020   Procedure: COLONOSCOPY WITH PROPOFOL;  Surgeon: Toledo, Boykin Nearing, MD;  Location: [facility] ENDOSCOPY;  Service: Gastroenterology;  Laterality: N/A;   SHOULDER ARTHROSCOPY     TONSILLECTOMY      Current Outpatient Medications  Medication Sig Dispense Refill   albuterol (VENTOLIN HFA) 108 (90 Base) MCG/ACT inhaler Inhale 2 puffs into the lungs every 6 (six) hours as needed for wheezing or shortness of breath. 8 g 0   ARMOUR THYROID 180 MG tablet Take 90-180 mg by mouth See admin instructions. Take 1 tablet (180mg ) every other night; alternate nights with  tablet (90mg )     benzonatate (TESSALON) 200 MG capsule Take 1 capsule (200 mg total) by mouth 3 (three) times daily as needed for cough. 30 capsule 0   levocetirizine (XYZAL) 5 MG tablet Take 5 mg by mouth every evening.     montelukast (SINGULAIR) 10 MG tablet Take 10 mg by mouth at bedtime.     Multiple Vitamins-Minerals (MULTIVITAMIN GUMMIES ADULT) CHEW Chew 1 tablet by mouth daily.     losartan-hydrochlorothiazide (HYZAAR) 100-25 MG tablet Take 1 tablet by mouth daily. 90 tablet 3   No current facility-administered medications for this visit.    Allergies:   Penicillins   Social History:  The patient  reports that she has never smoked. She has never used smokeless tobacco. She reports current alcohol use. She reports that she does not use drugs.   Family History:   family history includes Diabetes  in her brother; Heart attack in her father; Heart disease in her father and mother; Hypertension in her brother, mother, and sister.    Review of Systems: Review of Systems  Constitutional: Negative.   HENT: Negative.    Respiratory: Negative.    Cardiovascular: Negative.   Gastrointestinal: Negative.   Musculoskeletal: Negative.   Neurological: Negative.   Psychiatric/Behavioral: Negative.    All other systems reviewed and are  negative.    PHYSICAL EXAM: VS:  BP (!) 144/82 (BP Location: Right Arm, Patient Position: Sitting, Cuff Size: Large)   Pulse 61   Ht 5\' 6"  (1.676 m)   Wt (!) 331 lb (150.1 kg)   SpO2 98%   BMI 53.42 kg/m  , BMI Body mass index is 53.42 kg/m. GEN: Well nourished, well developed, in no acute distress HEENT: normal Neck: no JVD, carotid bruits, or masses Cardiac: RRR; no murmurs, rubs, or gallops,no edema  Respiratory:  clear to auscultation bilaterally, normal work of breathing GI: soft, nontender, nondistended, + BS MS: no deformity or atrophy Skin: warm and dry, no rash Neuro:  Strength and sensation are intact Psych: euthymic mood, full affect   Recent Labs: 11/18/2022: ALT 28; B Natriuretic Peptide 63.2; Magnesium 2.1 11/19/2022: BUN 17; Creatinine, Ser 0.67; Hemoglobin 14.6; Platelets 164; Potassium 4.0; Sodium 139    Lipid Panel Lab Results  Component Value Date   CHOL 108 11/19/2022   HDL 37 (L) 11/19/2022   LDLCALC 58 11/19/2022   TRIG 66 11/19/2022      Wt Readings from Last 3 Encounters:  07/03/23 (!) 331 lb (150.1 kg)  11/18/22 (!) 314 lb 6 oz (142.6 kg)  09/04/22 (!) 315 lb 9.6 oz (143.2 kg)     ASSESSMENT AND PLAN:  Problem List Items Addressed This Visit       Cardiology Problems   Hypertension   Relevant Medications   losartan-hydrochlorothiazide (HYZAAR) 100-25 MG tablet   Other Relevant Orders   EKG 12-Lead (Completed)     Other   Sleep apnea   Morbid obesity with BMI of 50.0-59.9, adult (HCC)   Other Visit Diagnoses     Chest pain of uncertain etiology    -  Primary   Relevant Orders   EKG 12-Lead (Completed)       Essential hypertension Blood pressure is well controlled on today's visit. No changes made to the medications.  Refills provided  PVC Asymptomatic, normal EKG on today's visit Normal ejection fraction on numerous prior echoes No further workup needed No indication for beta-blocker at this time  Aortic  atherosclerosis Minimal atherosclerosis noted on CT scan chest No significant coronary calcification No strong indication for statin  Hyperlipidemia Lifestyle modification recommended  Sleep apnea On CPAP, well-tolerated  History of chest pain Denies active chest pain concerning for angina No further ischemic workup needed at this time  Patient was seen in consultation for Dr. Burnett Sheng and will be referred back to his office for ongoing care of the issues detailed above   Signed, Dossie Arbour, M.D., Ph.D. [facility] [place], [place] [phone]

## 2023-07-03 ENCOUNTER — Encounter: Payer: Self-pay | Admitting: Cardiovascular Disease

## 2023-07-03 ENCOUNTER — Ambulatory Visit: Payer: Federal, State, Local not specified - PPO | Attending: Cardiovascular Disease | Admitting: Cardiovascular Disease

## 2023-07-03 VITALS — BP 144/82 | HR 61 | Ht 66.0 in | Wt 331.0 lb

## 2023-07-03 DIAGNOSIS — Z6841 Body Mass Index (BMI) 40.0 and over, adult: Secondary | ICD-10-CM

## 2023-07-03 DIAGNOSIS — G4730 Sleep apnea, unspecified: Secondary | ICD-10-CM | POA: Diagnosis not present

## 2023-07-03 DIAGNOSIS — I10 Essential (primary) hypertension: Secondary | ICD-10-CM | POA: Diagnosis not present

## 2023-07-03 DIAGNOSIS — R079 Chest pain, unspecified: Secondary | ICD-10-CM | POA: Diagnosis not present

## 2023-07-03 MED ORDER — LOSARTAN POTASSIUM-HCTZ 100-25 MG PO TABS: 1.0000 | ORAL_TABLET | Freq: Every day | ORAL | 3 refills | Status: DC

## 2023-07-03 NOTE — Patient Instructions (Signed)

## 2023-08-07 DIAGNOSIS — G4733 Obstructive sleep apnea (adult) (pediatric): Secondary | ICD-10-CM | POA: Diagnosis not present

## 2023-08-27 DIAGNOSIS — E039 Hypothyroidism, unspecified: Secondary | ICD-10-CM | POA: Diagnosis not present

## 2023-10-09 DIAGNOSIS — K080 Exfoliation of teeth due to systemic causes: Secondary | ICD-10-CM | POA: Diagnosis not present

## 2023-10-22 DIAGNOSIS — E039 Hypothyroidism, unspecified: Secondary | ICD-10-CM | POA: Diagnosis not present

## 2023-10-24 DIAGNOSIS — E039 Hypothyroidism, unspecified: Secondary | ICD-10-CM | POA: Diagnosis not present

## 2023-10-24 DIAGNOSIS — R7303 Prediabetes: Secondary | ICD-10-CM | POA: Diagnosis not present

## 2023-12-25 DIAGNOSIS — G4733 Obstructive sleep apnea (adult) (pediatric): Secondary | ICD-10-CM | POA: Diagnosis not present

## 2024-02-07 DIAGNOSIS — E039 Hypothyroidism, unspecified: Secondary | ICD-10-CM | POA: Diagnosis not present

## 2024-02-11 ENCOUNTER — Other Ambulatory Visit: Payer: Self-pay

## 2024-02-11 ENCOUNTER — Emergency Department

## 2024-02-11 DIAGNOSIS — I517 Cardiomegaly: Secondary | ICD-10-CM | POA: Diagnosis not present

## 2024-02-11 DIAGNOSIS — R0989 Other specified symptoms and signs involving the circulatory and respiratory systems: Secondary | ICD-10-CM | POA: Diagnosis not present

## 2024-02-11 DIAGNOSIS — R0789 Other chest pain: Secondary | ICD-10-CM | POA: Diagnosis not present

## 2024-02-11 DIAGNOSIS — R0602 Shortness of breath: Secondary | ICD-10-CM | POA: Insufficient documentation

## 2024-02-11 DIAGNOSIS — N3000 Acute cystitis without hematuria: Secondary | ICD-10-CM | POA: Insufficient documentation

## 2024-02-11 DIAGNOSIS — I10 Essential (primary) hypertension: Secondary | ICD-10-CM | POA: Insufficient documentation

## 2024-02-11 DIAGNOSIS — J810 Acute pulmonary edema: Secondary | ICD-10-CM | POA: Diagnosis not present

## 2024-02-11 DIAGNOSIS — E039 Hypothyroidism, unspecified: Secondary | ICD-10-CM | POA: Insufficient documentation

## 2024-02-11 DIAGNOSIS — R600 Localized edema: Secondary | ICD-10-CM | POA: Diagnosis not present

## 2024-02-11 DIAGNOSIS — M79662 Pain in left lower leg: Secondary | ICD-10-CM | POA: Diagnosis not present

## 2024-02-11 LAB — BASIC METABOLIC PANEL WITH GFR
Anion gap: 9 (ref 5–15)
BUN: 19 mg/dL (ref 8–23)
CO2: 26 mmol/L (ref 22–32)
Calcium: 9.2 mg/dL (ref 8.9–10.3)
Chloride: 104 mmol/L (ref 98–111)
Creatinine, Ser: 0.76 mg/dL (ref 0.44–1.00)
GFR, Estimated: >60 mL/min (ref >60)
Glucose, Bld: 109 mg/dL — ABNORMAL HIGH (ref 70–99)
Potassium: 4.3 mmol/L (ref 3.5–5.1)
Sodium: 139 mmol/L (ref 135–145)

## 2024-02-11 LAB — CBC
HCT: 44.6 % (ref 36.0–46.0)
Hemoglobin: 14.9 g/dL (ref 12.0–15.0)
MCH: 30.1 pg (ref 26.0–34.0)
MCHC: 33.4 g/dL (ref 30.0–36.0)
MCV: 90.1 fL (ref 80.0–100.0)
Platelets: 193 10*3/uL (ref 150–400)
RBC: 4.95 MIL/uL (ref 3.87–5.11)
RDW: 13.2 % (ref 11.5–15.5)
WBC: 11.6 10*3/uL — ABNORMAL HIGH (ref 4.0–10.5)
nRBC: 0 % (ref 0.0–0.2)

## 2024-02-11 NOTE — ED Triage Notes (Signed)
 Pt presents via POV c/o SOB x1 week. Reporets symptoms have gradually gotten worse. Ambulatory to triage. A&O x4.   Denies cough.

## 2024-02-12 ENCOUNTER — Emergency Department
Admission: EM | Admit: 2024-02-12 | Discharge: 2024-02-12 | Disposition: A | Discharge status: Home / Self Care | Attending: Emergency Medicine | Admitting: Emergency Medicine

## 2024-02-12 ENCOUNTER — Emergency Department

## 2024-02-12 DIAGNOSIS — N3000 Acute cystitis without hematuria: Secondary | ICD-10-CM

## 2024-02-12 DIAGNOSIS — R0602 Shortness of breath: Secondary | ICD-10-CM

## 2024-02-12 DIAGNOSIS — M79662 Pain in left lower leg: Secondary | ICD-10-CM | POA: Diagnosis not present

## 2024-02-12 DIAGNOSIS — R600 Localized edema: Secondary | ICD-10-CM

## 2024-02-12 LAB — URINALYSIS, ROUTINE W REFLEX MICROSCOPIC
Bilirubin Urine: NEGATIVE
Glucose, UA: NEGATIVE mg/dL
Hgb urine dipstick: NEGATIVE
Ketones, ur: NEGATIVE mg/dL
Nitrite: NEGATIVE
Protein, ur: NEGATIVE mg/dL
Specific Gravity, Urine: 1.006 (ref 1.005–1.030)
pH: 6 (ref 5.0–8.0)

## 2024-02-12 LAB — RESP PANEL BY RT-PCR (RSV, FLU A&B, COVID)  RVPGX2
Influenza A by PCR: NEGATIVE
Influenza B by PCR: NEGATIVE
Resp Syncytial Virus by PCR: NEGATIVE
SARS Coronavirus 2 by RT PCR: NEGATIVE

## 2024-02-12 LAB — TROPONIN I (HIGH SENSITIVITY)
Troponin I (High Sensitivity): 17 ng/L (ref <18)
Troponin I (High Sensitivity): 17 ng/L (ref <18)

## 2024-02-12 LAB — D-DIMER, QUANTITATIVE: D-Dimer, Quant: 0.46 ug{FEU}/mL (ref 0.00–0.50)

## 2024-02-12 LAB — BRAIN NATRIURETIC PEPTIDE: B Natriuretic Peptide: 222.3 pg/mL — ABNORMAL HIGH (ref 0.0–100.0)

## 2024-02-12 MED ORDER — CEPHALEXIN 500 MG PO CAPS: 500.0000 mg | ORAL_CAPSULE | Freq: Three times a day (TID) | ORAL | 0 refills | Status: DC

## 2024-02-12 MED ORDER — FUROSEMIDE 10 MG/ML IJ SOLN
20.0000 mg | Freq: Once | INTRAMUSCULAR | Status: AC
  Administered 2024-02-12: 20 mg via INTRAVENOUS
  Filled 2024-02-12: qty 4

## 2024-02-12 MED ORDER — FUROSEMIDE 20 MG PO TABS: 20.0000 mg | ORAL_TABLET | Freq: Every day | ORAL | 0 refills | Status: DC

## 2024-02-12 NOTE — ED Provider Notes (Signed)
 [facility] Provider Note    Event Date/Time   First MD Initiated Contact with Patient 02/12/24 0405     (approximate)   History   Shortness of Breath   HPI  Emily Jefferson is a 63 y.o. female who presents to the ED from home with a chief complaint of shortness of breath x 1 week.  History of morbid obesity, hypertension, sleep apnea who recently traveled to [place] .  Reports orthopnea and dyspnea on exertion occasionally with associated chest tightness.  Notes increased BLE, particularly on the left.  Denies associated fever/chills, abdominal pain, nausea/vomiting or dizziness.     Past Medical History   Past Medical History:  Diagnosis Date   Allergy    Arthritis    Chicken pox    Dysrhythmia    Gallbladder disease    Hypertension    Sleep apnea    Thyroid  disease      Active Problem List   Patient Active Problem List   Diagnosis Date Noted   CAP (community acquired pneumonia) 11/18/2022   Hypertension 11/18/2022   Sleep apnea 11/18/2022   Hypothyroidism 11/18/2022   Hypokalemia 11/18/2022   Morbid obesity with BMI of 50.0-59.9, adult (HCC) 11/18/2022   Myocardial injury 11/18/2022     Past Surgical History   Past Surgical History:  Procedure Laterality Date   ABDOMINAL HYSTERECTOMY     CARPAL TUNNEL RELEASE     CHOLECYSTECTOMY     COLONOSCOPY WITH PROPOFOL  N/A 07/08/2020   Procedure: COLONOSCOPY WITH PROPOFOL ;  Surgeon: Toledo, Alphonsus Jeans, MD;  Location: [facility] ENDOSCOPY;  Service: Gastroenterology;  Laterality: N/A;   SHOULDER ARTHROSCOPY     TONSILLECTOMY       Home Medications   Prior to Admission medications   Medication Sig Start Date End Date Taking? Authorizing Provider  cephALEXin (KEFLEX) 500 MG capsule Take 1 capsule (500 mg total) by mouth 3 (three) times daily. 02/12/24  Yes Tori Dattilio J, MD  furosemide (LASIX) 20 MG tablet Take 1 tablet (20 mg total) by mouth daily. 02/12/24 02/11/25 Yes Norlene Beavers, MD   albuterol  (VENTOLIN  HFA) 108 [phone] Base) MCG/ACT inhaler Inhale 2 puffs into the lungs every 6 (six) hours as needed for wheezing or shortness of breath. 11/20/22   Margery Sheets B, MD  ARMOUR THYROID  180 MG tablet Take 90-180 mg by mouth See admin instructions. Take 1 tablet (180mg ) every other night; alternate nights with  tablet (90mg )    [provider]  benzonatate  (TESSALON ) 200 MG capsule Take 1 capsule (200 mg total) by mouth 3 (three) times daily as needed for cough. 11/20/22   Tiajuana Fluke, MD  levocetirizine (XYZAL) 5 MG tablet Take 5 mg by mouth every evening.    [provider]  losartan -hydrochlorothiazide  (HYZAAR) 100-25 MG tablet Take 1 tablet by mouth daily. 07/03/23   Gollan, Timothy J, MD  montelukast  (SINGULAIR ) 10 MG tablet Take 10 mg by mouth at bedtime.    [provider]  Multiple Vitamins-Minerals (MULTIVITAMIN GUMMIES ADULT) CHEW Chew 1 tablet by mouth daily.    [provider]     Allergies  Penicillins   Family History   Family History  Problem Relation Age of Onset   Hypertension Mother    Heart disease Mother    Heart attack Father    Heart disease Father    Hypertension Sister    Diabetes Brother    Hypertension Brother    Breast cancer Neg Hx  Physical Exam  Triage Vital Signs: ED Triage Vitals [02/11/24 2309]  Encounter Vitals Group     BP (!) 152/113     Girls Systolic BP Percentile      Girls Diastolic BP Percentile      Boys Systolic BP Percentile      Boys Diastolic BP Percentile      Pulse Rate 71     Resp 18     Temp (!) 97.5 F (36.4 C)     Temp Source Oral     SpO2 95 %     Weight      Height      Head Circumference      Peak Flow      Pain Score 4     Pain Loc      Pain Education      Exclude from Growth Chart     Updated Vital Signs: BP (!) 174/69   Pulse 79   Temp 98.3 F (36.8 C) (Oral)   Resp (!) 22   SpO2 95%    General: Awake, no distress.  CV:  RRR.  Good  peripheral perfusion.  Resp:  Normal effort.  Faint bibasilar rales. Abd:  Obese, nontender.  No distention.  Other:  1+ BLE nonpitting edema.  Symmetrically sized calves but patient feels like her left one is more swollen.   ED Results / Procedures / Treatments  Labs (all labs ordered are listed, but only abnormal results are displayed) Labs Reviewed  BASIC METABOLIC PANEL WITH GFR - Abnormal; Notable for the following components:      Result Value   Glucose, Bld 109 (*)    All other components within normal limits  CBC - Abnormal; Notable for the following components:   WBC 11.6 (*)    All other components within normal limits  BRAIN NATRIURETIC PEPTIDE - Abnormal; Notable for the following components:   B Natriuretic Peptide 222.3 (*)    All other components within normal limits  URINALYSIS, ROUTINE W REFLEX MICROSCOPIC - Abnormal; Notable for the following components:   Color, Urine STRAW (*)    APPearance CLEAR (*)    Leukocytes,Ua MODERATE (*)    Bacteria, UA RARE (*)    All other components within normal limits  RESP PANEL BY RT-PCR (RSV, FLU A&B, COVID)  [id number]  D-DIMER, QUANTITATIVE  TROPONIN I (HIGH SENSITIVITY)  TROPONIN I (HIGH SENSITIVITY)     EKG  ED ECG REPORT I, Oriana Horiuchi J, the attending physician, personally viewed and interpreted this ECG.   Date: 02/12/2024  EKG Time: 2315  Rate: 69  Rhythm: normal sinus rhythm  Axis: Normal  Intervals:PVCs  ST&T Change: Nonspecific    RADIOLOGY I have independently visualized and interpreted patient's imaging studies as well as noted the radiology interpretation:  Chest x-ray: Pulmonary vascular congestion  Ultrasound: No DVT  Official radiology report(s): US  Venous Img Lower Unilateral Left (DVT) Result Date: 02/12/2024 CLINICAL DATA:  Calf pain EXAM: LEFT LOWER EXTREMITY VENOUS DOPPLER ULTRASOUND TECHNIQUE: Gray-scale sonography with graded compression, as well as color Doppler and duplex ultrasound were  performed to evaluate the lower extremity deep venous systems from the level of the common femoral vein and including the common femoral, femoral, profunda femoral, popliteal and calf veins including the posterior tibial, peroneal and gastrocnemius veins when visible. The superficial great saphenous vein was also interrogated. Spectral Doppler was utilized to evaluate flow at rest and with distal augmentation maneuvers in the common femoral, femoral and popliteal veins.  COMPARISON:  None Available. FINDINGS: Contralateral Common Femoral Vein: Respiratory phasicity is normal and symmetric with the symptomatic side. No evidence of thrombus. Normal compressibility. Common Femoral Vein: No evidence of thrombus. Normal compressibility, respiratory phasicity and response to augmentation. Saphenofemoral Junction: No evidence of thrombus. Normal compressibility and flow on color Doppler imaging. Profunda Femoral Vein: No evidence of thrombus. Normal compressibility and flow on color Doppler imaging. Femoral Vein: No evidence of thrombus. Normal compressibility, respiratory phasicity and response to augmentation. Popliteal Vein: No evidence of thrombus. Normal compressibility, respiratory phasicity and response to augmentation. Calf Veins: No evidence of thrombus. Normal compressibility and flow on color Doppler imaging. Other Findings:  None. IMPRESSION: No evidence of deep venous thrombosis. Electronically Signed   By: Donnal Fusi M.D.   On: 02/12/2024 05:42   DG Chest 2 View Result Date: 02/11/2024 CLINICAL DATA:  Shortness of breath EXAM: CHEST - 2 VIEW COMPARISON:  11/18/2022 FINDINGS: Mild cardiomegaly with central vascular congestion. No pleural effusion. Atelectasis or scarring in the left lower lung IMPRESSION: Mild cardiomegaly with central vascular congestion. Electronically Signed   By: Esmeralda Hedge M.D.   On: 02/11/2024 23:43     PROCEDURES:  Critical Care performed: No  .1-3 Lead EKG  Interpretation  Performed by: Norlene Beavers, MD Authorized by: Norlene Beavers, MD     Interpretation: normal     ECG rate:  70   ECG rate assessment: normal     Rhythm: sinus rhythm     Ectopy: none     Conduction: normal   Comments:     Patient placed on cardiac monitor to evaluate for arrhythmias    MEDICATIONS ORDERED IN ED: Medications  furosemide (LASIX) injection 20 mg (20 mg Intravenous Given 02/12/24 0529)     IMPRESSION / MDM / ASSESSMENT AND PLAN / ED COURSE  I reviewed the triage vital signs and the nursing notes.                             63 year old female presenting with shortness of breath. Differential includes, but is not limited to, viral syndrome, bronchitis including COPD exacerbation, pneumonia, reactive airway disease including asthma, CHF including exacerbation with or without pulmonary/interstitial edema, pneumothorax, ACS, thoracic trauma, and pulmonary embolism.  I personally reviewed patient's records and note endocrinology office visit from 10/24/2023 for acquired hypothyroidism.  Patient's presentation is most consistent with acute complicated illness / injury requiring diagnostic workup.  The patient is on the cardiac monitor to evaluate for evidence of arrhythmia and/or significant heart rate changes.  Laboratory results unremarkable, initial troponin negative.  Mildly elevated BNP, respiratory panel negative.  Chest x-ray consistent with mild pulmonary vascular congestion.  Will repeat troponin, check D-dimer, obtain DVT ultrasound of left lower leg.  Administer low-dose IV Lasix and reassess.  Clinical Course as of 02/12/24 0640  Tue Feb 12, 2024  0623 Updated patient and spouse on all laboratory and imaging results.  Negative DVT ultrasound, negative repeat troponin, negative D-dimer.  She sees Dr. Gollan from cardiology.  Will prescribe 3-day course of Lasix and patient will follow-up closely with her cardiologist.  She may need echocardiogram which  she last had 2 years ago.  Will send urine to evaluate for UTI. [JS]  [id number] UA positive for UTI.  Will discharge home on antibiotics.  Strict return precautions given.  Patient and spouse verbalized understanding and agree with plan of care. [JS]    Clinical  Course User Index [JS] Norlene Beavers, MD     FINAL CLINICAL IMPRESSION(S) / ED DIAGNOSES   Final diagnoses:  Shortness of breath  Peripheral edema  Acute cystitis without hematuria     Rx / DC Orders   ED Discharge Orders          Ordered    furosemide (LASIX) 20 MG tablet  Daily        02/12/24 0624    cephALEXin (KEFLEX) 500 MG capsule  3 times daily        02/12/24 5784             Note:  This document was prepared using Dragon voice recognition software and may include unintentional dictation errors.   Aldeen Riga J, MD 02/12/24 [phone]

## 2024-02-12 NOTE — ED Notes (Signed)
 Pt ambulatory to waiting room. Pt verbalized understanding of discharge instructions.

## 2024-02-12 NOTE — ED Notes (Signed)
 Pt taking to US 

## 2024-02-12 NOTE — Discharge Instructions (Addendum)
 Take fluid pill daily x 3 days.  Take and finish antibiotic as prescribed.  Start your next dose Wednesday morning.  Return to the ER for worsening symptoms, persistent vomiting, difficulty breathing or other concerns.

## 2024-02-13 ENCOUNTER — Encounter: Payer: Self-pay | Admitting: Cardiology

## 2024-02-13 ENCOUNTER — Ambulatory Visit: Attending: Cardiology | Admitting: Physician Assistant

## 2024-02-13 VITALS — BP 129/69 | HR 72 | Ht 66.0 in | Wt 330.6 lb

## 2024-02-13 DIAGNOSIS — E785 Hyperlipidemia, unspecified: Secondary | ICD-10-CM

## 2024-02-13 DIAGNOSIS — R0602 Shortness of breath: Secondary | ICD-10-CM

## 2024-02-13 DIAGNOSIS — I10 Essential (primary) hypertension: Secondary | ICD-10-CM

## 2024-02-13 DIAGNOSIS — G4730 Sleep apnea, unspecified: Secondary | ICD-10-CM | POA: Diagnosis not present

## 2024-02-13 DIAGNOSIS — R7303 Prediabetes: Secondary | ICD-10-CM | POA: Diagnosis not present

## 2024-02-13 DIAGNOSIS — E063 Autoimmune thyroiditis: Secondary | ICD-10-CM | POA: Diagnosis not present

## 2024-02-13 DIAGNOSIS — Z79899 Other long term (current) drug therapy: Secondary | ICD-10-CM

## 2024-02-13 DIAGNOSIS — M7989 Other specified soft tissue disorders: Secondary | ICD-10-CM

## 2024-02-13 DIAGNOSIS — I493 Ventricular premature depolarization: Secondary | ICD-10-CM

## 2024-02-13 NOTE — Progress Notes (Signed)
 Cardiology Office Note    Date:  02/13/2024   ID:  Emily Jefferson, DOB [birth year], MRN [id number]  PCP:  Lyle San, MD  Cardiologist:  None  Electrophysiologist:  None   Chief Complaint: ED follow up  History of Present Illness:   Emily Jefferson is a 63 y.o. female with history of hypertension, hypothyroidism, obesity, and sleep apnea on CPAP who presents for ED follow up.     Patient reportedly previously followed with a cardiologist in [place], [place] . Echo 07/2017 with normal LV function and EF estimated at 50% with moderate MR and moderate TR. Stress test 08/2017 normal.   [facility] after moving to the area. Echo 02/2020 showed EF greater than 55% with mild LVH, mild TR, and moderate MR.  Stress echo done 11/2020 showed EF greater than 55% with normal stress echo.   Patient established with Dr. Gollan 06/2023 after referral from PCP for chest pain.  Patient was overall doing well from a cardiac perspective.  She reported asymptomatic PVCs.  No further testing or medication changes were indicated at that time.  Patient was seen in the emergency department 02/11/2024 with complaints of shortness of breath and associated chest tightness for 1 week.  EKG and troponin negative.  BNP was mildly elevated in the 200s with chest x-ray consistent with mild pulmonary vascular congestion. Given IV lasix 20 mg in ED. Patient was prescribed 3-day course of Lasix 20 mg daily and instructed to follow-up closely with cardiology.  Her UA was also positive for UTI and she was discharged on antibiotics.  Patient reports on exertion at baseline.  However, for the past week she has noticed worsening dyspnea on exertion which is associated with feeling hot and sweating.  She describes this as being unable to take a deep breath.  She also endorses an associated warm feeling in her abdomen upper abdomen.  She also reports 1 episode of heart racing.  In the ED, the provider  noted that her left lower extremity was swelling.  She attributed this to multiple mosquito bites on this leg.  She denies any chest pain, lightheadedness, dizziness, orthopnea, and PND.  No recent illness.  No bleeding or hematochezia.  She initially thought this was due to her thyroid .  When her symptoms continue to worsen, she reported to the emergency department late Monday night.  She received IV Lasix 20 mg with good urine output.  Since discharge, she is taken 1 additional dose of 20 mg Lasix which she did not feel increased urine output.  Overall, she is feeling better with improvements in shortness of breath.  She is without symptoms of angina and cardiac decompensation.  Labs independently reviewed: 02/11/2024-NA 139, K4.3, BUN 19, creatinine 0.76, WBC 11.6, Hgb 14.9, HCT 44.6, platelets 193, troponin negative x 2, BNP 222  Objective   Past Medical History:  Diagnosis Date   Allergy    Arthritis    Chicken pox    Dysrhythmia    Gallbladder disease    Hypertension    Sleep apnea    Thyroid  disease     Current Medications: Current Meds  Medication Sig   ARMOUR THYROID  180 MG tablet Take 90-180 mg by mouth See admin instructions. Take 1 tablet (180mg ) every other night; alternate nights with  tablet (90mg )   cephALEXin (KEFLEX) 500 MG capsule Take 1 capsule (500 mg total) by mouth 3 (three) times daily.   furosemide (LASIX) 20 MG tablet Take 1 tablet (  20 mg total) by mouth daily.   levocetirizine (XYZAL) 5 MG tablet Take 5 mg by mouth every evening.   losartan -hydrochlorothiazide  (HYZAAR) 100-25 MG tablet Take 1 tablet by mouth daily.   montelukast  (SINGULAIR ) 10 MG tablet Take 10 mg by mouth at bedtime.    Allergies:   Penicillins   Social History   Socioeconomic History   Marital status: Married    Spouse name: Not on file   Number of children: Not on file   Years of education: Not on file   Highest education level: Not on file  Occupational History   Not on file   Tobacco Use   Smoking status: Never   Smokeless tobacco: Never  Vaping Use   Vaping status: Never Used  Substance and Sexual Activity   Alcohol use: Yes   Drug use: Never   Sexual activity: Yes  Other Topics Concern   Not on file  Social History Narrative   Not on file   Social Drivers of Health   Financial Resource Strain: Patient Declined (04/17/2023)   Received from [facility]   Overall Financial Resource Strain (CARDIA)    Difficulty of Paying Living Expenses: Patient declined  Food Insecurity: Patient Declined (04/17/2023)   Received from [facility]   Hunger Vital Sign    Within the past 12 months, you worried that your food would run out before you got the money to buy more.: Patient declined    Within the past 12 months, the food you bought just didn't last and you didn't have money to get more.: Patient declined  Transportation Needs: Patient Declined (04/17/2023)   Received from [facility] - Transportation    In the past 12 months, has lack of transportation kept you from medical appointments or from getting medications?: Patient declined    Lack of Transportation (Non-Medical): Patient declined  Physical Activity: Not on file  Stress: Not on file  Social Connections: Not on file     Family History:  The patient's family history includes Diabetes in her brother; Heart attack in her father; Heart disease in her father and mother; Hypertension in her brother, mother, and sister. There is no history of Breast cancer.  ROS:   12-point review of systems is negative unless otherwise noted in the HPI.   EKGs/Other Studies Reviewed:    Studies reviewed were summarized above. The additional studies were reviewed today:  12/14/2020 Echo stress ([facility]) Normal Stress Echocardiogram  NORMAL RIGHT VENTRICULAR SYSTOLIC FUNCTION  MILD VALVULAR REGURGITATION (See above)  NO VALVULAR STENOSIS NOTED  Resting EF:  >55% (Est.)  Post Stress EF: >55% (Est.)  ECG Results: Normal  Tricuspid: MILD TR   EKG:  EKG personally reviewed by me today    PHYSICAL EXAM:    VS:  BP 129/69   Pulse 72   Ht 5' 6 (1.676 m)   Wt (!) 330 lb 9.6 oz (150 kg)   SpO2 94%   BMI 53.36 kg/m   BMI: Body mass index is 53.36 kg/m.  Physical Exam Vitals and nursing note reviewed.  Constitutional:      General: She is not in acute distress.    Appearance: Normal appearance. She is obese.   Cardiovascular:     Rate and Rhythm: Normal rate and regular rhythm.     Heart sounds: No murmur heard. Pulmonary:     Effort: Pulmonary effort is normal. No respiratory distress.  Breath sounds: No wheezing or rales.   Musculoskeletal:     Right lower leg: No edema.     Left lower leg: No edema.   Skin:    General: Skin is warm and dry.   Neurological:     General: No focal deficit present.     Mental Status: She is alert and oriented to person, place, and time. Mental status is at baseline.   Psychiatric:        Mood and Affect: Mood normal.        Behavior: Behavior normal.     Wt Readings from Last 3 Encounters:  02/13/24 (!) 330 lb 9.6 oz (150 kg)  07/03/23 (!) 331 lb (150.1 kg)  11/18/22 (!) 314 lb 6 oz (142.6 kg)       ASSESSMENT & PLAN:   Shortness of breath Lower extremity swelling - Patient seen in the ED 6/16 for worsening dyspnea on exertion and lower extremity swelling. BNP in the ED 222 with mild vascular congestion noted on CXR. She received IV lasix 20 mg and was prescribed 3 days of oral Lasix 20 mg daily. She appears euvolemic on exam today. No indication for standing diuretic at this time. Check BMP today. Echocardiogram ordered to further evaluate pump function with further recommendations pending results.   Essential hypertension - BP well controlled. Continue losartan -hydrochlorothiazide  as prescribed.   Hyperlipidemia - Most recent lipid panel 03/2023 with LDL 93. Continued management  with lifestyle modification.   Sleep apnea - Continued on CPAP.   PVCs - Asymptomatic, noted on EKG today. Check echo as above. No indication for beta blocker at this time.   Disposition: F/u with Dr. Gollan or an APP in 4-6 weeks, after testing.   Medication Adjustments/Labs and Tests Ordered: Current medicines are reviewed at length with the patient today.  Concerns regarding medicines are outlined above. Medication changes, Labs and Tests ordered today are summarized above and listed in the Patient Instructions accessible in Encounters.   Beather Liming, PA-C 02/13/2024 3:47 PM     [facility] [place] [address] [place], [place] [address] [phone]

## 2024-02-13 NOTE — Patient Instructions (Addendum)
 Medication Instructions:  Your physician recommends that you continue on your current medications as directed. Please refer to the Current Medication list given to you today.   *If you need a refill on your cardiac medications before your next appointment, please call your pharmacy*  Lab Work: Your provider would like for you to have following labs drawn today BMP.   If you have labs (blood work) drawn today and your tests are completely normal, you will receive your results only by: MyChart Message (if you have MyChart) OR A paper copy in the mail If you have any lab test that is abnormal or we need to change your treatment, we will call you to review the results.  Testing/Procedures: Your physician has requested that you have an echocardiogram. Echocardiography is a painless test that uses sound waves to create images of your heart. It provides your doctor with information about the size and shape of your heart and how well your heart's chambers and valves are working.   You may receive an ultrasound enhancing agent through an IV if needed to better visualize your heart during the echo. This procedure takes approximately one hour.  There are no restrictions for this procedure.  This will take place at [address]) [address], [place] [address]  Please note: We ask at that you not bring children with you during ultrasound (echo/ vascular) testing. Due to room size and safety concerns, children are not allowed in the ultrasound rooms during exams. Our front office staff cannot provide observation of children in our lobby area while testing is being conducted. An adult accompanying a patient to their appointment will only be allowed in the ultrasound room at the discretion of the ultrasound technician under special circumstances. We apologize for any inconvenience.   Follow-Up: At [facility], you and your health needs are our priority.  As part of our  continuing mission to provide you with exceptional heart care, our providers are all part of one team.  This team includes your primary Cardiologist (physician) and Advanced Practice Providers or APPs (Physician Assistants and Nurse Practitioners) who all work together to provide you with the care you need, when you need it.  Your next appointment:   6 week(s)  Provider:   Belva Boyden, MD, or Gildardo Labrador, PA

## 2024-02-14 ENCOUNTER — Ambulatory Visit: Payer: Self-pay | Admitting: Physician Assistant

## 2024-02-14 LAB — BASIC METABOLIC PANEL WITH GFR
BUN/Creatinine Ratio: 17 (ref 12–28)
BUN: 13 mg/dL (ref 8–27)
CO2: 21 mmol/L (ref 20–29)
Calcium: 9 mg/dL (ref 8.7–10.3)
Chloride: 104 mmol/L (ref 96–106)
Creatinine, Ser: 0.77 mg/dL (ref 0.57–1.00)
Glucose: 104 mg/dL — ABNORMAL HIGH (ref 70–99)
Potassium: 3.7 mmol/L (ref 3.5–5.2)
Sodium: 142 mmol/L (ref 134–144)
eGFR: 88 mL/min/{1.73_m2} (ref >59)

## 2024-03-04 ENCOUNTER — Ambulatory Visit: Attending: Physician Assistant

## 2024-03-04 DIAGNOSIS — I517 Cardiomegaly: Secondary | ICD-10-CM | POA: Diagnosis not present

## 2024-03-04 DIAGNOSIS — I088 Other rheumatic multiple valve diseases: Secondary | ICD-10-CM

## 2024-03-04 DIAGNOSIS — R0602 Shortness of breath: Secondary | ICD-10-CM

## 2024-03-04 LAB — ECHOCARDIOGRAM COMPLETE
AR max vel: 2.65 cm2
AV Area VTI: 2.52 cm2
AV Area mean vel: 2.5 cm2
AV Mean grad: 4 mmHg
AV Peak grad: 8 mmHg
Ao pk vel: 1.41 m/s
Area-P 1/2: 5.13 cm2
S' Lateral: 5.24 cm

## 2024-03-24 ENCOUNTER — Other Ambulatory Visit: Payer: Self-pay | Admitting: Family Medicine

## 2024-03-24 DIAGNOSIS — Z1231 Encounter for screening mammogram for malignant neoplasm of breast: Secondary | ICD-10-CM

## 2024-03-25 NOTE — Progress Notes (Unsigned)
 Cardiology Office Note    Date:  03/27/2024   ID:  Emily Jefferson, DOB 1961-03-10, MRN [id number]  PCP:  Valora Agent, MD  Cardiologist:  None  Electrophysiologist:  None   Chief Complaint: Follow up  History of Present Illness:   Emily Jefferson is a 63 y.o. female with history of hypertension, hypothyroidism, obesity, and sleep apnea on CPAP who presents for follow up on shortness of breath and LE swelling.    Patient reportedly previously followed with a cardiologist in [place], [place] . Echo 07/2017 with normal LV function and EF estimated at 50% with moderate MR and moderate TR. Stress test 08/2017 normal.   [facility] established with [facility] in [facility] after moving to the area. Echo 02/2020 showed EF greater than 55% with mild LVH, mild TR, and moderate MR.  Stress echo done 11/2020 showed EF greater than 55% with normal stress echo.   Patient established with Dr. Gollan 06/2023 after referral from PCP for chest pain.  Patient was overall doing well from a cardiac perspective.  She reported asymptomatic PVCs.  No further testing or medication changes were indicated at that time.   Patient was seen in the emergency department 02/11/2024 with complaints of shortness of breath and associated chest tightness for 1 week.  EKG and troponin negative.  BNP was mildly elevated in the 200s with chest x-ray consistent with mild pulmonary vascular congestion. Given IV lasix  20 mg in ED. Patient was prescribed 3-day course of Lasix  20 mg daily and instructed to follow-up closely with cardiology.  Her UA was also positive for UTI and she was discharged on antibiotics   Patient was most recently seen by myself for ED follow-up 02/13/2024.  She endorsed improvements in dyspnea and lower extremity swelling.  Echo was ordered and completed 03/04/2024 which showed normal LV systolic function with mild MR.  Patient presents to clinic today overall doing well from a cardiac perspective.  She reports that  dyspnea and lower extremity swelling have resolved.  She has been recently experiencing hot flashes which she suspects is related to her thyroid .  She occasionally takes her blood pressure at home and reports values in the 130s over 80s.  She otherwise denies chest pain, shortness of breath, palpitations, lightheadedness, dizziness, orthopnea, lower extremity swelling, and PND.  Labs independently reviewed: 02/13/2024-BUN 13, creatinine 0.77, sodium 142, K 3.7  Objective   Past Medical History:  Diagnosis Date   Allergy    Arthritis    Chicken pox    Dysrhythmia    Gallbladder disease    Hypertension    Sleep apnea    Thyroid  disease     Current Medications: Current Meds  Medication Sig   ARMOUR THYROID  180 MG tablet Take 90-180 mg by mouth See admin instructions. Take 1 tablet (180mg ) every other night; alternate nights with  tablet (90mg ) (Patient taking differently: Take 150 mg by mouth See admin instructions. Take 1 tablet (150mg ) every other night; alternate nights with  tablet (90mg ))   levocetirizine (XYZAL) 5 MG tablet Take 5 mg by mouth every evening.   losartan -hydrochlorothiazide  (HYZAAR) 100-25 MG tablet Take 1 tablet by mouth daily.   montelukast  (SINGULAIR ) 10 MG tablet Take 10 mg by mouth at bedtime.    Allergies:   Penicillins   Social History   Socioeconomic History   Marital status: Married    Spouse name: Not on file   Number of children: Not on file   Years of education: Not on file  Highest education level: Not on file  Occupational History   Not on file  Tobacco Use   Smoking status: Never   Smokeless tobacco: Never  Vaping Use   Vaping status: Never Used  Substance and Sexual Activity   Alcohol use: Yes   Drug use: Never   Sexual activity: Yes  Other Topics Concern   Not on file  Social History Narrative   Not on file   Social Drivers of Health   Financial Resource Strain: Patient Declined (04/17/2023)   Received from [facility]   Overall Financial Resource Strain (CARDIA)    Difficulty of Paying Living Expenses: Patient declined  Food Insecurity: Patient Declined (04/17/2023)   Received from [facility]   Hunger Vital Sign    Within the past 12 months, you worried that your food would run out before you got the money to buy more.: Patient declined    Within the past 12 months, the food you bought just didn't last and you didn't have money to get more.: Patient declined  Transportation Needs: Patient Declined (04/17/2023)   Received from [facility] - Transportation    In the past 12 months, has lack of transportation kept you from medical appointments or from getting medications?: Patient declined    Lack of Transportation (Non-Medical): Patient declined  Physical Activity: Not on file  Stress: Not on file  Social Connections: Not on file     Family History:  The patient's family history includes Diabetes in her brother; Heart attack in her father; Heart disease in her father and mother; Hypertension in her brother, mother, and sister. There is no history of Breast cancer.  ROS:   12-point review of systems is negative unless otherwise noted in the HPI.  EKGs/Other Studies Reviewed:    Studies reviewed were summarized above. The additional studies were reviewed today:  03/04/2024 Echo complete 1. Left ventricular ejection fraction, by estimation, is 55 to 60%. The  left ventricle has normal function. The left ventricle has no regional  wall motion abnormalities. Left ventricular diastolic parameters were  normal. The average left ventricular  global longitudinal strain is -13.8 %. The global longitudinal strain is  abnormal.   2. Right ventricular systolic function is normal. The right ventricular  size is normal.   3. Left atrial size was mildly dilated.   4. The mitral valve is normal in structure. Mild mitral valve  regurgitation. No evidence  of mitral stenosis.   5. The aortic valve is normal in structure. Aortic valve regurgitation is  not visualized. No aortic stenosis is present.   6. The inferior vena cava is normal in size with greater than 50%  respiratory variability, suggesting right atrial pressure of 3 mmHg.   EKG:  EKG completed 01/23/2024 personally reviewed by me today EKG Interpretation Date/Time:  Thursday March 27 2024 09:37:51 EDT Ventricular Rate:  65 PR Interval:  134 QRS Duration:  94 QT Interval:  382 QTC Calculation: 397 R Axis:   41  Text Interpretation: Sinus rhythm with occasional Premature ventricular complexes Nonspecific T wave abnormality When compared with ECG of 11-Feb-2024 23:15, No significant change was found Confirmed by Lorene Sinclair ([id number]) on 03/27/2024 9:40:36 AMshowed normal sinus rhythm  PHYSICAL EXAM:    VS:  BP 136/78 (BP Location: Right Wrist, Patient Position: Sitting, Cuff Size: Normal)   Pulse 65   Ht 5' 6 (1.676 m)   Wt (!) 331 lb (150.1  kg)   SpO2 95%   BMI 53.42 kg/m   BMI: Body mass index is 53.42 kg/m.  Physical Exam Vitals and nursing note reviewed.  Constitutional:      General: She is not in acute distress.    Appearance: Normal appearance.  Cardiovascular:     Rate and Rhythm: Normal rate and regular rhythm.     Heart sounds: No murmur heard. Pulmonary:     Effort: Pulmonary effort is normal. No respiratory distress.     Breath sounds: No wheezing or rales.  Musculoskeletal:     Right lower leg: No edema.     Left lower leg: No edema.  Skin:    General: Skin is warm and dry.  Neurological:     General: No focal deficit present.     Mental Status: She is alert and oriented to person, place, and time. Mental status is at baseline.  Psychiatric:        Mood and Affect: Mood normal.        Behavior: Behavior normal.     Wt Readings from Last 3 Encounters:  03/27/24 (!) 331 lb (150.1 kg)  02/13/24 (!) 330 lb 9.6 oz (150 kg)  07/03/23 (!) 331 lb  (150.1 kg)        ASSESSMENT & PLAN:   Shortness of breath Lower extremity swelling - Patient seen in the ED 6/16 for worsening dyspnea on exertion and lower extremity swelling.  Treated with IV Lasix  and discharged with a short course of oral Lasix .  Echo ordered at last visit and completed 03/04/2024 revealing EF 55 to 60% with mild MR.  Symptoms have now resolved.  She appears euvolemic on exam.  No indication for standing diuretic at this time.  Essential hypertension - Blood pressure mildly elevated today and per home readings.  Recommended keeping BP log for the next 2 weeks.  She has an upcoming appointment with her PCP and agrees to bring her log to this appointment.  Continue losartan -hydrochlorothiazide .  Hyperlipidemia - Most recent lipid panel 03/2023 with LDL 93.  Ongoing management with lifestyle modification.  Sleep apnea - Continued on CPAP.  PVCs - Asymptomatic, noted on EKG.  Prior stress echo 2022 without ischemia.  Echo 02/2024 with normal EF, no RWMA. No indication for medication at this time.    Disposition: F/u with Dr. Gollan or an APP in 6 months.   Medication Adjustments/Labs and Tests Ordered: Current medicines are reviewed at length with the patient today.  Concerns regarding medicines are outlined above. Medication changes, Labs and Tests ordered today are summarized above and listed in the Patient Instructions accessible in Encounters.   Bonney Lesley Maffucci, PA-C 03/27/2024 10:17 AM     [facility] [place] [address] [place], [place] [address] [phone]

## 2024-03-26 ENCOUNTER — Ambulatory Visit: Admitting: Cardiology

## 2024-03-27 ENCOUNTER — Ambulatory Visit: Attending: Physician Assistant | Admitting: Physician Assistant

## 2024-03-27 VITALS — BP 136/78 | HR 65 | Ht 66.0 in | Wt 331.0 lb

## 2024-03-27 DIAGNOSIS — I493 Ventricular premature depolarization: Secondary | ICD-10-CM

## 2024-03-27 DIAGNOSIS — I10 Essential (primary) hypertension: Secondary | ICD-10-CM

## 2024-03-27 DIAGNOSIS — R0602 Shortness of breath: Secondary | ICD-10-CM | POA: Diagnosis not present

## 2024-03-27 DIAGNOSIS — I5020 Unspecified systolic (congestive) heart failure: Secondary | ICD-10-CM | POA: Diagnosis not present

## 2024-03-27 DIAGNOSIS — I4819 Other persistent atrial fibrillation: Secondary | ICD-10-CM | POA: Diagnosis not present

## 2024-03-27 DIAGNOSIS — M7989 Other specified soft tissue disorders: Secondary | ICD-10-CM

## 2024-03-27 DIAGNOSIS — E785 Hyperlipidemia, unspecified: Secondary | ICD-10-CM

## 2024-03-27 DIAGNOSIS — G4730 Sleep apnea, unspecified: Secondary | ICD-10-CM

## 2024-03-27 NOTE — Patient Instructions (Signed)
 Medication Instructions:  Your physician recommends that you continue on your current medications as directed. Please refer to the Current Medication list given to you today.    *If you need a refill on your cardiac medications before your next appointment, please call your pharmacy*  Lab Work: None ordered at this time  If you have labs (blood work) drawn today and your tests are completely normal, you will receive your results only by: MyChart Message (if you have MyChart) OR A paper copy in the mail If you have any lab test that is abnormal or we need to change your treatment, we will call you to review the results.  Testing/Procedures: Blood pressure log given  Follow-Up: At [facility], you and your health needs are our priority.  As part of our continuing mission to provide you with exceptional heart care, our providers are all part of one team.  This team includes your primary Cardiologist (physician) and Advanced Practice Providers or APPs (Physician Assistants and Nurse Practitioners) who all work together to provide you with the care you need, when you need it.  Your next appointment:   6 month(s)  Provider:   You may see Lesley Maffucci, PA-C or Bernardino Bring, PA-C

## 2024-04-10 ENCOUNTER — Ambulatory Visit
Admission: RE | Admit: 2024-04-10 | Discharge: 2024-04-10 | Disposition: A | Discharge status: Home / Self Care | Source: Ambulatory Visit | Attending: Family Medicine | Admitting: Family Medicine

## 2024-04-10 DIAGNOSIS — Z1231 Encounter for screening mammogram for malignant neoplasm of breast: Secondary | ICD-10-CM | POA: Diagnosis not present

## 2024-04-17 DIAGNOSIS — Z0000 Encounter for general adult medical examination without abnormal findings: Secondary | ICD-10-CM | POA: Diagnosis not present

## 2024-04-17 DIAGNOSIS — I10 Essential (primary) hypertension: Secondary | ICD-10-CM | POA: Diagnosis not present

## 2024-04-17 DIAGNOSIS — Z13220 Encounter for screening for lipoid disorders: Secondary | ICD-10-CM | POA: Diagnosis not present

## 2024-04-17 DIAGNOSIS — Z1389 Encounter for screening for other disorder: Secondary | ICD-10-CM | POA: Diagnosis not present

## 2024-04-17 DIAGNOSIS — E781 Pure hyperglyceridemia: Secondary | ICD-10-CM | POA: Diagnosis not present

## 2024-04-23 DIAGNOSIS — G4733 Obstructive sleep apnea (adult) (pediatric): Secondary | ICD-10-CM | POA: Diagnosis not present

## 2024-04-23 DIAGNOSIS — Z0000 Encounter for general adult medical examination without abnormal findings: Secondary | ICD-10-CM | POA: Diagnosis not present

## 2024-04-23 DIAGNOSIS — M545 Low back pain, unspecified: Secondary | ICD-10-CM | POA: Diagnosis not present

## 2024-04-23 DIAGNOSIS — I10 Essential (primary) hypertension: Secondary | ICD-10-CM | POA: Diagnosis not present

## 2024-04-23 DIAGNOSIS — Z1331 Encounter for screening for depression: Secondary | ICD-10-CM | POA: Diagnosis not present

## 2024-04-30 DIAGNOSIS — G4733 Obstructive sleep apnea (adult) (pediatric): Secondary | ICD-10-CM | POA: Diagnosis not present

## 2024-06-05 DIAGNOSIS — R7303 Prediabetes: Secondary | ICD-10-CM | POA: Diagnosis not present

## 2024-06-11 ENCOUNTER — Ambulatory Visit (INDEPENDENT_AMBULATORY_CARE_PROVIDER_SITE_OTHER): Admitting: Podiatry

## 2024-06-11 ENCOUNTER — Ambulatory Visit

## 2024-06-11 DIAGNOSIS — M19071 Primary osteoarthritis, right ankle and foot: Secondary | ICD-10-CM

## 2024-06-11 DIAGNOSIS — M2140 Flat foot [pes planus] (acquired), unspecified foot: Secondary | ICD-10-CM | POA: Diagnosis not present

## 2024-06-11 DIAGNOSIS — M19079 Primary osteoarthritis, unspecified ankle and foot: Secondary | ICD-10-CM

## 2024-06-11 DIAGNOSIS — M19072 Primary osteoarthritis, left ankle and foot: Secondary | ICD-10-CM

## 2024-06-11 NOTE — Progress Notes (Signed)
  Subjective:  Patient ID: Emily Jefferson, female    DOB: 1961-03-18,  MRN: [id number] HPI Chief Complaint  Patient presents with   Foot Orthotics    63 y.o. female presents with the above complaint.   ROS: Denies fever chills nausea mobic muscle aches pains calf pain back pain chest pain shortness of breath.  Has had orthotics in the past when she lived in [place].  The most recent ones fabricated by [facility] did not work out well for her she would like to try new set.  History of pes planovalgus and fasciitis.  Past Medical History:  Diagnosis Date   Allergy    Arthritis    Chicken pox    Dysrhythmia    Gallbladder disease    Hypertension    Sleep apnea    Thyroid  disease    Past Surgical History:  Procedure Laterality Date   ABDOMINAL HYSTERECTOMY     CARPAL TUNNEL RELEASE     CHOLECYSTECTOMY     COLONOSCOPY WITH PROPOFOL  N/A 07/08/2020   Procedure: COLONOSCOPY WITH PROPOFOL ;  Surgeon: Toledo, Ladell POUR, MD;  Location: [facility] ENDOSCOPY;  Service: Gastroenterology;  Laterality: N/A;   SHOULDER ARTHROSCOPY     TONSILLECTOMY      Current Outpatient Medications:    ARMOUR THYROID  180 MG tablet, Take 90-180 mg by mouth See admin instructions. Take 1 tablet (180mg ) every other night; alternate nights with  tablet (90mg ) (Patient taking differently: Take 150 mg by mouth See admin instructions. Take 1 tablet (150mg ) every other night; alternate nights with  tablet (90mg )), Disp: , Rfl:    levocetirizine (XYZAL) 5 MG tablet, Take 5 mg by mouth every evening., Disp: , Rfl:    losartan -hydrochlorothiazide  (HYZAAR) 100-25 MG tablet, Take 1 tablet by mouth daily., Disp: 90 tablet, Rfl: 3   montelukast  (SINGULAIR ) 10 MG tablet, Take 10 mg by mouth at bedtime., Disp: , Rfl:   Allergies  Allergen Reactions   Penicillins Rash and Dermatitis   Review of Systems Objective:  There were no vitals filed for this visit.  General: Well developed, nourished, in no acute distress,  alert and oriented x3   Dermatological: Skin is warm, dry and supple bilateral. Nails x 10 are well maintained; remaining integument appears unremarkable at this time. There are no open sores, no preulcerative lesions, no rash or signs of infection present.  Vascular: Dorsalis Pedis artery and Posterior Tibial artery pedal pulses are 2/4 bilateral with immedate capillary fill time. Pedal hair growth present. No varicosities and no lower extremity edema present bilateral.   Neruologic: Grossly intact via light touch bilateral. Vibratory intact via tuning fork bilateral. Protective threshold with Semmes Wienstein monofilament intact to all pedal sites bilateral. Patellar and Achilles deep tendon reflexes 2+ bilateral. No Babinski or clonus noted bilateral.   Musculoskeletal: No gross boney pedal deformities bilateral. No pain, crepitus, or limitation noted with foot and ankle range of motion bilateral. Muscular strength 5/5 in all groups tested bilateral.  Pes planovalgus flexible in nature.  Gait: Unassisted, Nonantalgic.    Radiographs:  Radiographs taken today demonstrate osseously mature individual.  Significant pes planovalgus is noted bilaterally.  Plantar distally oriented calcaneal heel spurs.  Early osteoarthritic changes of the bilateral foot particularly in the midfoot  Assessment & Plan:   Assessment: Pes planovalgus bilateral history of plantar fasciitis.  Osteoarthritis bilateral  Plan: We are going to schedule her with [facility] for orthotics to be fabricated.     Nichole Neyer T. [place], [place]

## 2024-06-12 DIAGNOSIS — E063 Autoimmune thyroiditis: Secondary | ICD-10-CM | POA: Diagnosis not present

## 2024-06-12 DIAGNOSIS — Z6841 Body Mass Index (BMI) 40.0 and over, adult: Secondary | ICD-10-CM | POA: Diagnosis not present

## 2024-06-12 DIAGNOSIS — R7303 Prediabetes: Secondary | ICD-10-CM | POA: Diagnosis not present

## 2024-06-19 ENCOUNTER — Other Ambulatory Visit: Payer: Self-pay | Admitting: Cardiovascular Disease

## 2024-06-23 ENCOUNTER — Encounter: Payer: Self-pay | Admitting: Medical

## 2024-06-23 ENCOUNTER — Other Ambulatory Visit: Payer: Self-pay

## 2024-06-23 ENCOUNTER — Ambulatory Visit (INDEPENDENT_AMBULATORY_CARE_PROVIDER_SITE_OTHER): Payer: Self-pay | Admitting: Medical

## 2024-06-23 VITALS — BP 140/80 | HR 71 | Temp 99.1°F | Ht 66.0 in | Wt 333.0 lb

## 2024-06-23 DIAGNOSIS — L989 Disorder of the skin and subcutaneous tissue, unspecified: Secondary | ICD-10-CM

## 2024-06-23 MED ORDER — MUPIROCIN 2 % EX OINT: 1.0000 | TOPICAL_OINTMENT | Freq: Two times a day (BID) | CUTANEOUS | 0 refills | Status: AC

## 2024-06-23 NOTE — Patient Instructions (Signed)
-  Apply antibiotic ointment twice daily for next 5-7 days. -Avoid scratching lesions. -Send MyChart message to provider or schedule return visit as needed for new/worsening symptoms or if symptoms do not resolve as discussed with prescribed treatment.

## 2024-06-23 NOTE — Progress Notes (Signed)
 [facility] [address] [place], [place] [address]   Office Visit Note  Patient Name: Emily Jefferson Date of Birth [id number]  Medical Record number [id number]  Date of Service: 06/23/2024  Chief Complaint  Patient presents with   Acute Visit    Patient reports having 5 or 6 red spots on her chest above her R breast. She thinks they may be spider bites. She first noticed it last week. She has applied cortisone cream to the area.      HPI 63 y.o. female presents with skin lesions.   Initially noted one red raised papule to right upper chest about a week ago. Developed 2-3 more over next several days. Not itchy or painful. No lesions elsewhere. Feels well. No unusual fatigue, chills, fever, joint pain or myalgia.  Has tried applying OTC hydrocortisone cream last few days without change.   Current Medication:  Outpatient Encounter Medications as of 06/23/2024  Medication Sig Note   CONTRAVE 8-90 MG TB12 Take 1 tablet by mouth daily.    levocetirizine (XYZAL) 5 MG tablet Take 5 mg by mouth every evening.    losartan -hydrochlorothiazide  (HYZAAR) 100-25 MG tablet TAKE 1 TABLET BY MOUTH DAILY    montelukast  (SINGULAIR ) 10 MG tablet Take 10 mg by mouth at bedtime.    ARMOUR THYROID  180 MG tablet Take 90-180 mg by mouth See admin instructions. Take 1 tablet (180mg ) every other night; alternate nights with  tablet (90mg ) (Patient taking differently: Take 150 mg by mouth See admin instructions. Take 1 tablet (150mg ) every other night; alternate nights with  tablet (90mg )) 11/18/2022: 180mg  dose for 11/18/2022   No facility-administered encounter medications on file as of 06/23/2024.      Medical History: Past Medical History:  Diagnosis Date   Allergy    Arthritis    Chicken pox    Dysrhythmia    Gallbladder disease    Hypertension    Sleep apnea    Thyroid  disease      Vital Signs: BP (!) 140/80   Pulse 71   Temp 99.1 F (37.3 C)   Ht 5' 6 (1.676  m)   Wt (!) 333 lb (151 kg)   SpO2 95%   BMI 53.75 kg/m    Review of Systems  Constitutional:  Negative for chills, fatigue and fever.  Skin:  Positive for rash.    Physical Exam Vitals reviewed.  Constitutional:      General: She is not in acute distress.    Appearance: She is not ill-appearing.  Chest:       Comments: ~4-5 slightly papular lesions with moderate bright erythema to right medial chest, nontender, no drainage Neurological:     Mental Status: She is alert.    Assessment/Plan: 1. Skin lesion of chest wall (Primary) Perhaps small skin infection. Will trial Mupirocin ointment twice daily. Patient encouraged to follow up with new/worsening symptoms or if redness does not resolve with prescribed treatment.  - mupirocin ointment (BACTROBAN) 2 %; Apply 1 Application topically 2 (two) times daily for 7 days.  Dispense: 22 g; Refill: 0  Patient Instructions  -Apply antibiotic ointment twice daily for next 5-7 days. -Avoid scratching lesions. -Send MyChart message to provider or schedule return visit as needed for new/worsening symptoms or if symptoms do not resolve as discussed with prescribed treatment.       General Counseling: Emily Jefferson understanding of the findings of todays visit and plan of treatment. she has been encouraged to call  the office with any questions or concerns that should arise related to todays visit.   Emily Arts PA-C Physician Assistant

## 2024-07-11 ENCOUNTER — Ambulatory Visit (INDEPENDENT_AMBULATORY_CARE_PROVIDER_SITE_OTHER)

## 2024-07-11 DIAGNOSIS — M19079 Primary osteoarthritis, unspecified ankle and foot: Secondary | ICD-10-CM

## 2024-07-11 DIAGNOSIS — M2141 Flat foot [pes planus] (acquired), right foot: Secondary | ICD-10-CM

## 2024-07-11 DIAGNOSIS — M2142 Flat foot [pes planus] (acquired), left foot: Secondary | ICD-10-CM | POA: Diagnosis not present

## 2024-07-11 NOTE — Progress Notes (Signed)
  Orthotics   Patient was present and evaluated for Custom molded foot orthotics. Patient will benefit from CFO's to provide total contact to BIL MLA's helping to balance and distribute body weight more evenly across BIL feet helping to reduce plantar pressure and pain. Orthotic will also encourage FF / RF alignment  Patient was scanned today and will return for fitting upon receipt  Ded met 15% coins patient is aware   Lolita Schultze

## 2024-09-02 ENCOUNTER — Telehealth: Payer: Self-pay | Admitting: Podiatrist

## 2024-09-02 NOTE — Telephone Encounter (Signed)
 Patient stated she received a bill and never received orthotics, was told by [facility] are running late, Patient tried calling, was unable to reach anyone in orthotics department

## 2024-09-12 ENCOUNTER — Telehealth: Payer: Self-pay | Admitting: Podiatry

## 2024-09-12 DIAGNOSIS — I493 Ventricular premature depolarization: Secondary | ICD-10-CM | POA: Insufficient documentation

## 2024-09-12 DIAGNOSIS — R079 Chest pain, unspecified: Secondary | ICD-10-CM | POA: Insufficient documentation

## 2024-09-12 NOTE — Telephone Encounter (Signed)
 Spoke with patient on 09/12/24 at 12:42 to pick up orthotics

## 2024-09-12 NOTE — Progress Notes (Unsigned)
 Cardiology Office Note  Date:  09/15/2024   ID:  Emily Jefferson, DOB [birth year], MRN [id number]  PCP:  Valora Lynwood FALCON, MD   Chief Complaint  Patient presents with   6 month follow up    Patient c/o shortness of breath with over exertion and climbing stairs.     HPI:  Emily Jefferson is a 64 year old woman with past medical history of Hypertension Hypothyroidism Obesity Sleep apnea on CPAP PVCs, no symptoms Who presents for follow-up of her chest pain at rest, hypertension morbid obesity, irregular heart beat/DVT  LOV 11/24 In follow-up reports doing relatively well BP well controlled, numbers reviewed from the past several months On losartan  HCTZ 100/25 daily Pressure from 108 up to 130s, on average in the 120 systolic range  PVCs on EKG today and by history,  Reports she is relatively asymptomatic  No significant lower extremity edema No chest pain  Prior imaging reviewed Echocardiogram June 2025 EF normal RV normal size and function No significant valvular heart disease  July 2025:  SOB, in hospital Visiting mother for several days, high salt foods Blood pressure was elevated in the ER, had leg swelling BNP elevated Diastolic CHF suspected  Issues with sweating on exertion TSH 0.145, dosing recently decreased  EKG personally reviewed by myself on todays visit EKG Interpretation Date/Time:  Monday September 15 2024 09:24:19 EST Ventricular Rate:  60 PR Interval:  120 QRS Duration:  94 QT Interval:  416 QTC Calculation: 416 R Axis:   59  Text Interpretation: Sinus rhythm with occasional Premature ventricular complexes ST & T wave abnormality, consider inferolateral ischemia When compared with ECG of 27-Mar-2024 09:37, T wave inversion now evident in Inferior leads Confirmed by Perla Lye ([id number]) on 09/15/2024 9:37:35 AM   Viral pneumonia hospitalization March 2024,  Prior studies reviewed Several echoes over the past several years, normal ejection  fraction  Echo July 2021  revealed mild to moderate MR with preserved LV function. Pulmonary pressures were mildly elevated at 46.5.   Stress echo April 2022 Normal study, no focal wall motion abnormality    PMH:   has a past medical history of Allergy, Arthritis, Chicken pox, Dysrhythmia, Gallbladder disease, Hypertension, Sleep apnea, and Thyroid  disease.  PSH:    Past Surgical History:  Procedure Laterality Date   ABDOMINAL HYSTERECTOMY     CARPAL TUNNEL RELEASE     CHOLECYSTECTOMY     COLONOSCOPY WITH PROPOFOL  N/A 07/08/2020   Procedure: COLONOSCOPY WITH PROPOFOL ;  Surgeon: Toledo, Ladell POUR, MD;  Location: [facility] ENDOSCOPY;  Service: Gastroenterology;  Laterality: N/A;   SHOULDER ARTHROSCOPY     TONSILLECTOMY      Current Outpatient Medications  Medication Sig Dispense Refill   aspirin  EC 81 MG tablet Take 81 mg by mouth daily. Swallow whole.     levocetirizine (XYZAL) 5 MG tablet Take 5 mg by mouth every evening.     levothyroxine (SYNTHROID) 112 MCG tablet Take 112 mcg by mouth daily before breakfast.     losartan -hydrochlorothiazide  (HYZAAR) 100-25 MG tablet TAKE 1 TABLET BY MOUTH DAILY 90 tablet 0   meloxicam (MOBIC) 7.5 MG tablet Take 7.5 mg by mouth as needed for pain.     montelukast  (SINGULAIR ) 10 MG tablet Take 10 mg by mouth at bedtime.     CONTRAVE 8-90 MG TB12 Take 1 tablet by mouth daily.     No current facility-administered medications for this visit.    Allergies:   Penicillins   Social History:  The patient  reports that she has never smoked. She has never used smokeless tobacco. She reports current alcohol use. She reports that she does not use drugs.   Family History:   family history includes Diabetes in her brother; Heart attack in her father; Heart disease in her father and mother; Hypertension in her brother, mother, and sister.    Review of Systems: Review of Systems  Constitutional: Negative.   HENT: Negative.    Respiratory: Negative.     Cardiovascular: Negative.   Gastrointestinal: Negative.   Musculoskeletal: Negative.   Neurological: Negative.   Psychiatric/Behavioral: Negative.    All other systems reviewed and are negative.   PHYSICAL EXAM: VS:  BP (!) 148/80 (BP Location: Left Arm, Patient Position: Sitting, Cuff Size: Large)   Pulse 60   Ht 5' 5 (1.651 m)   Wt (!) 330 lb 6 oz (149.9 kg)   SpO2 97%   BMI 54.98 kg/m  , BMI Body mass index is 54.98 kg/m. GEN: Well nourished, well developed, in no acute distress HEENT: normal Neck: no JVD, carotid bruits, or masses Cardiac: RRR; no murmurs, rubs, or gallops,no edema  Respiratory:  clear to auscultation bilaterally, normal work of breathing GI: soft, nontender, nondistended, + BS MS: no deformity or atrophy Skin: warm and dry, no rash Neuro:  Strength and sensation are intact Psych: euthymic mood, full affect   Recent Labs: 02/11/2024: B Natriuretic Peptide 222.3; Hemoglobin 14.9; Platelets 193 02/13/2024: BUN 13; Creatinine, Ser 0.77; Potassium 3.7; Sodium 142    Lipid Panel Lab Results  Component Value Date   CHOL 108 11/19/2022   HDL 37 (L) 11/19/2022   LDLCALC 58 11/19/2022   TRIG 66 11/19/2022      Wt Readings from Last 3 Encounters:  09/15/24 (!) 330 lb 6 oz (149.9 kg)  06/23/24 (!) 333 lb (151 kg)  03/27/24 (!) 331 lb (150.1 kg)     ASSESSMENT AND PLAN:  Problem List Items Addressed This Visit       Cardiology Problems   PVC's (premature ventricular contractions)   Relevant Medications   aspirin  EC 81 MG tablet   Other Relevant Orders   EKG 12-Lead (Completed)   Hypertension   Relevant Medications   aspirin  EC 81 MG tablet   Other Relevant Orders   EKG 12-Lead (Completed)     Other   Chest pain of uncertain etiology - Primary   Relevant Orders   EKG 12-Lead (Completed)   Sleep apnea    Essential hypertension Blood pressure is well controlled on today's visit. No changes made to the medications.  PVC noted on  today's EKG Normal ejection fraction on numerous prior echoes Asymptomatic, no further workup needed  Aortic atherosclerosis Minimal atherosclerosis noted on CT scan chest No significant coronary calcification Weight trending downward Currently not on a statin  Hyperlipidemia Lifestyle modification recommended Weight trending down  Sleep apnea On CPAP, well-tolerated  History of chest pain No recent chest pain concerning for angina  Leg edema Stable on today's visit Recommend if she does develop leg swelling concerning for CHF that she take Lasix  20 as needed -Recent issues after visit with her mother, likely from high blood pressure, high salt foods, eating out   Signed, Velinda Lunger, M.D., Ph.D. [facility] [place], [place] [phone]

## 2024-09-15 ENCOUNTER — Ambulatory Visit: Admitting: Cardiovascular Disease

## 2024-09-15 ENCOUNTER — Encounter: Payer: Self-pay | Admitting: Cardiovascular Disease

## 2024-09-15 VITALS — BP 148/80 | HR 60 | Ht 65.0 in | Wt 330.4 lb

## 2024-09-15 DIAGNOSIS — G4730 Sleep apnea, unspecified: Secondary | ICD-10-CM | POA: Diagnosis not present

## 2024-09-15 DIAGNOSIS — I493 Ventricular premature depolarization: Secondary | ICD-10-CM

## 2024-09-15 DIAGNOSIS — I10 Essential (primary) hypertension: Secondary | ICD-10-CM | POA: Diagnosis not present

## 2024-09-15 DIAGNOSIS — R079 Chest pain, unspecified: Secondary | ICD-10-CM

## 2024-09-15 MED ORDER — LOSARTAN POTASSIUM-HCTZ 100-25 MG PO TABS: 1.0000 | ORAL_TABLET | Freq: Every day | ORAL | 3 refills | Status: AC

## 2024-09-15 MED ORDER — FUROSEMIDE 20 MG PO TABS: 20.0000 mg | ORAL_TABLET | Freq: Every day | ORAL | 3 refills | Status: AC | PRN

## 2024-09-15 NOTE — Patient Instructions (Addendum)
 Medication Instructions:  Lasix  20 daily as needed for leg swelling, SOB  If you need a refill on your cardiac medications before your next appointment, please call your pharmacy.   Lab work: No new labs needed  Testing/Procedures: No new testing needed  Follow-Up: At [facility], you and your health needs are our priority.  As part of our continuing mission to provide you with exceptional heart care, we have created designated Provider Care Teams.  These Care Teams include your primary Cardiologist (physician) and Advanced Practice Providers (APPs -  Physician Assistants and Nurse Practitioners) who all work together to provide you with the care you need, when you need it.  You will need a follow up appointment in 12 months  Providers on your designated Care Team:   Lonni Meager, NP Bernardino Bring, PA-C [place] [place], [place]  COVID-19 Vaccine Information can be found at: [email or web address] For questions related to vaccine distribution or appointments, please email [email or web address] or call [phone].

## 2024-09-18 ENCOUNTER — Ambulatory Visit (INDEPENDENT_AMBULATORY_CARE_PROVIDER_SITE_OTHER)

## 2024-09-18 DIAGNOSIS — M2142 Flat foot [pes planus] (acquired), left foot: Secondary | ICD-10-CM

## 2024-09-18 DIAGNOSIS — M2141 Flat foot [pes planus] (acquired), right foot: Secondary | ICD-10-CM

## 2024-09-18 DIAGNOSIS — M19079 Primary osteoarthritis, unspecified ankle and foot: Secondary | ICD-10-CM

## 2024-09-18 DIAGNOSIS — M2140 Flat foot [pes planus] (acquired), unspecified foot: Secondary | ICD-10-CM

## 2024-09-18 NOTE — Progress Notes (Signed)
 Patient presents today for pickup of custom orthotics that were casted at previous appointment.  They were given to the patient today without issue. We checked the fit of the orthotics in the shoe and trimmed as necessary.  Patient did express understanding of gradual break-in period. Orthotics were tolerated well without complication.  Prentice Ovens, DPM
